# Patient Record
Sex: Female | Born: 1990 | Hispanic: Yes | Marital: Single | Smoking: Never smoker
Health system: Southern US, Community
[De-identification: ages and names within clinical notes are randomized; demographics above are authoritative.]

## PROBLEM LIST (undated history)

## (undated) ENCOUNTER — Inpatient Hospital Stay (HOSPITAL_COMMUNITY): Payer: Self-pay

## (undated) DIAGNOSIS — F419 Anxiety disorder, unspecified: Secondary | ICD-10-CM

## (undated) DIAGNOSIS — R51 Headache: Secondary | ICD-10-CM

## (undated) DIAGNOSIS — R519 Headache, unspecified: Secondary | ICD-10-CM

## (undated) HISTORY — PX: NO PAST SURGERIES: SHX2092

---

## 2017-02-23 ENCOUNTER — Inpatient Hospital Stay (HOSPITAL_COMMUNITY)
Admission: AD | Admit: 2017-02-23 | Discharge: 2017-02-23 | Disposition: A | Discharge status: Home / Self Care | Payer: Medicaid Other | Source: Ambulatory Visit | Attending: Obstetrics & Gynecology | Admitting: Obstetrics & Gynecology

## 2017-02-23 ENCOUNTER — Encounter (HOSPITAL_COMMUNITY): Payer: Self-pay | Admitting: *Deleted

## 2017-02-23 ENCOUNTER — Inpatient Hospital Stay (HOSPITAL_COMMUNITY): Payer: Medicaid Other

## 2017-02-23 DIAGNOSIS — O208 Other hemorrhage in early pregnancy: Secondary | ICD-10-CM | POA: Diagnosis not present

## 2017-02-23 DIAGNOSIS — O26891 Other specified pregnancy related conditions, first trimester: Secondary | ICD-10-CM | POA: Insufficient documentation

## 2017-02-23 DIAGNOSIS — R109 Unspecified abdominal pain: Secondary | ICD-10-CM | POA: Diagnosis not present

## 2017-02-23 DIAGNOSIS — O9989 Other specified diseases and conditions complicating pregnancy, childbirth and the puerperium: Secondary | ICD-10-CM

## 2017-02-23 DIAGNOSIS — O418X10 Other specified disorders of amniotic fluid and membranes, first trimester, not applicable or unspecified: Secondary | ICD-10-CM

## 2017-02-23 DIAGNOSIS — Z3A08 8 weeks gestation of pregnancy: Secondary | ICD-10-CM | POA: Insufficient documentation

## 2017-02-23 DIAGNOSIS — O26899 Other specified pregnancy related conditions, unspecified trimester: Secondary | ICD-10-CM

## 2017-02-23 DIAGNOSIS — Z3491 Encounter for supervision of normal pregnancy, unspecified, first trimester: Secondary | ICD-10-CM

## 2017-02-23 DIAGNOSIS — O468X1 Other antepartum hemorrhage, first trimester: Secondary | ICD-10-CM

## 2017-02-23 LAB — URINALYSIS, ROUTINE W REFLEX MICROSCOPIC
Bilirubin Urine: NEGATIVE
Glucose, UA: NEGATIVE mg/dL
Ketones, ur: NEGATIVE mg/dL
Leukocytes, UA: NEGATIVE
NITRITE: NEGATIVE
Protein, ur: NEGATIVE mg/dL
SPECIFIC GRAVITY, URINE: 1.005 (ref 1.005–1.030)
pH: 6 (ref 5.0–8.0)

## 2017-02-23 LAB — WET PREP, GENITAL
CLUE CELLS WET PREP: NONE SEEN
Sperm: NONE SEEN
Trich, Wet Prep: NONE SEEN
YEAST WET PREP: NONE SEEN

## 2017-02-23 LAB — HCG, QUANTITATIVE, PREGNANCY: HCG, BETA CHAIN, QUANT, S: 57072 m[IU]/mL — AB (ref <5)

## 2017-02-23 LAB — ABO/RH: ABO/RH(D): A POS

## 2017-02-23 LAB — CBC
HEMATOCRIT: 32.8 % — AB (ref 36.0–46.0)
HEMOGLOBIN: 11.7 g/dL — AB (ref 12.0–15.0)
MCH: 33.3 pg (ref 26.0–34.0)
MCHC: 35.7 g/dL (ref 30.0–36.0)
MCV: 93.4 fL (ref 78.0–100.0)
Platelets: 187 10*3/uL (ref 150–400)
RBC: 3.51 MIL/uL — ABNORMAL LOW (ref 3.87–5.11)
RDW: 12.8 % (ref 11.5–15.5)
WBC: 4.9 10*3/uL (ref 4.0–10.5)

## 2017-02-23 LAB — URINALYSIS, MICROSCOPIC (REFLEX)
Bacteria, UA: NONE SEEN
WBC, UA: NONE SEEN WBC/hpf (ref 0–5)

## 2017-02-23 LAB — POCT PREGNANCY, URINE: PREG TEST UR: POSITIVE — AB

## 2017-02-23 NOTE — MAU Note (Signed)
Hasn't had a period since March.  Had taken plan B in Feb. Did a HPT yesterday, was faintly positive.  occ rare cramps and breast tenderness. Had light spotting last night

## 2017-02-23 NOTE — MAU Provider Note (Signed)
History     CSN: [id number]  Arrival date and time: 02/23/17 1048  First Provider Initiated Contact with Patient 02/23/17 1137      Chief Complaint  Patient presents with  . Possible Pregnancy  . Abdominal Pain   HPI  Kelsey Levy is a 26 y.o. G1P0 at [redacted]w[redacted]d by LMP who presents with abdominal pain. Had positive HPT yesterday. States she took plan B after unprotected intercourse in November & February. LMP was 3/20 & was normal for her.   Reports lower abdominal cramping for the last few weeks that has been intermittent. Denies n/v/d, constipation, dysuria, vaginal bleeding, or vaginal discharge. Rates pain 2/10. Has not been treating.   OB History    Gravida Para Term Preterm AB Living   1             SAB TAB Ectopic Multiple Live Births                  Past Medical History:  Diagnosis Date  . Medical history non-contributory     Past Surgical History:  Procedure Laterality Date  . NO PAST SURGERIES      No family history on file.  Social History  Substance Use Topics  . Smoking status: Not on file  . Smokeless tobacco: Not on file  . Alcohol use Not on file    Allergies:  Allergies  Allergen Reactions  . Latex Anaphylaxis    No prescriptions prior to admission.    Review of Systems  Constitutional: Negative.   Gastrointestinal: Positive for abdominal pain. Negative for constipation, diarrhea, nausea and vomiting.  Genitourinary: Negative.    Physical Exam   Blood pressure (!) 120/54, pulse 60, temperature 98.2 F (36.8 C), temperature source Oral, resp. rate 16, height 5\' 6"  (1.676 m), weight 173 lb 12 oz (78.8 kg), last menstrual period 12/26/2016, SpO2 100 %.  Physical Exam  Nursing note and vitals reviewed. Constitutional: She is oriented to person, place, and time. She appears well-developed and well-nourished. No distress.  HENT:  Head: Normocephalic and atraumatic.  Eyes: Conjunctivae are normal. Right eye exhibits no discharge. Left eye  exhibits no discharge. No scleral icterus.  Neck: Normal range of motion.  Cardiovascular: Normal rate, regular rhythm and normal heart sounds.   No murmur heard. Respiratory: Effort normal and breath sounds normal. No respiratory distress. She has no wheezes.  GI: Soft. Bowel sounds are normal. She exhibits no distension. There is no tenderness. There is no rebound and no guarding.  Neurological: She is alert and oriented to person, place, and time.  Skin: Skin is warm and dry. She is not diaphoretic.  Psychiatric: She has a normal mood and affect. Her behavior is normal. Judgment and thought content normal.    MAU Course  Procedures Results for orders placed or performed during the hospital encounter of 02/23/17 (from the past 24 hour(s))  Urinalysis, Routine w reflex microscopic     Status: Abnormal   Collection Time: 02/23/17 11:05 AM  Result Value Ref Range   Color, Urine YELLOW YELLOW   APPearance CLEAR CLEAR   Specific Gravity, Urine 1.005 1.005 - 1.030   pH 6.0 5.0 - 8.0   Glucose, UA NEGATIVE NEGATIVE mg/dL   Hgb urine dipstick SMALL (A) NEGATIVE   Bilirubin Urine NEGATIVE NEGATIVE   Ketones, ur NEGATIVE NEGATIVE mg/dL   Protein, ur NEGATIVE NEGATIVE mg/dL   Nitrite NEGATIVE NEGATIVE   Leukocytes, UA NEGATIVE NEGATIVE  Urinalysis, Microscopic (reflex)  Status: Abnormal   Collection Time: 02/23/17 11:05 AM  Result Value Ref Range   RBC / HPF 0-5 0 - 5 RBC/hpf   WBC, UA NONE SEEN 0 - 5 WBC/hpf   Bacteria, UA NONE SEEN NONE SEEN   Squamous Epithelial / LPF 0-5 (A) NONE SEEN  Pregnancy, urine POC     Status: Abnormal   Collection Time: 02/23/17 11:13 AM  Result Value Ref Range   Preg Test, Ur POSITIVE (A) NEGATIVE  Wet prep, genital     Status: Abnormal   Collection Time: 02/23/17 11:33 AM  Result Value Ref Range   Yeast Wet Prep HPF POC NONE SEEN NONE SEEN   Trich, Wet Prep NONE SEEN NONE SEEN   Clue Cells Wet Prep HPF POC NONE SEEN NONE SEEN   WBC, Wet Prep HPF  POC FEW (A) NONE SEEN   Sperm NONE SEEN   CBC     Status: Abnormal   Collection Time: 02/23/17 11:36 AM  Result Value Ref Range   WBC 4.9 4.0 - 10.5 K/uL   RBC 3.51 (L) 3.87 - 5.11 MIL/uL   Hemoglobin 11.7 (L) 12.0 - 15.0 g/dL   HCT 16.1 (L) 09.6 - 04.5 %   MCV 93.4 78.0 - 100.0 fL   MCH 33.3 26.0 - 34.0 pg   MCHC 35.7 30.0 - 36.0 g/dL   RDW 40.9 81.1 - 91.4 %   Platelets 187 150 - 400 K/uL  ABO/Rh     Status: None   Collection Time: 02/23/17 11:36 AM  Result Value Ref Range   ABO/RH(D) A POS   hCG, quantitative, pregnancy     Status: Abnormal   Collection Time: 02/23/17 11:36 AM  Result Value Ref Range   hCG, Beta Chain, Quant, S 57,072 (H) <5 mIU/mL   US Ob Comp Less 14 Wks  Result Date: 02/23/2017 CLINICAL DATA:  Patient with abdominal pain. Assess for viability of pregnancy. EXAM: OBSTETRIC <14 WK [place] AND TRANSVAGINAL OB US TECHNIQUE: Both transabdominal and transvaginal ultrasound examinations were performed for complete evaluation of the gestation as well as the maternal uterus, adnexal regions, and pelvic cul-de-sac. Transvaginal technique was performed to assess early pregnancy. COMPARISON:  None. FINDINGS: Intrauterine gestational sac: Single Yolk sac:  Visualized. Embryo:  Visualized. Cardiac Activity: Visualized. Heart Rate: 162  bpm CRL:  16  mm   8 w   0 d                  [place] EDC: 09/27/2017 Subchorionic hemorrhage:  Small Maternal uterus/adnexae: Normal right left ovaries. No free fluid in the pelvis. IMPRESSION: Single live intrauterine gestation.  Small subchorionic hemorrhage. Electronically Signed   By: Annia Belt M.D.   On: 02/23/2017 14:14   US Ob Transvaginal  Result Date: 02/23/2017 CLINICAL DATA:  Patient with abdominal pain. Assess for viability of pregnancy. EXAM: OBSTETRIC <14 WK [place] AND TRANSVAGINAL OB US TECHNIQUE: Both transabdominal and transvaginal ultrasound examinations were performed for complete evaluation of the gestation as well as the maternal uterus,  adnexal regions, and pelvic cul-de-sac. Transvaginal technique was performed to assess early pregnancy. COMPARISON:  None. FINDINGS: Intrauterine gestational sac: Single Yolk sac:  Visualized. Embryo:  Visualized. Cardiac Activity: Visualized. Heart Rate: 162  bpm CRL:  16  mm   8 w   0 d                  [place] EDC: 09/27/2017 Subchorionic hemorrhage:  Small Maternal uterus/adnexae: Normal right left  ovaries. No free fluid in the pelvis. IMPRESSION: Single live intrauterine gestation.  Small subchorionic hemorrhage. Electronically Signed   By: Annia Beltrew  Davis M.D.   On: 02/23/2017 14:14   MDM +UPT UA, wet prep, GC/chlamydia, CBC, ABO/Rh, quant hCG, HIV, and US today to rule out ectopic pregnancy A positive Ultrasound shows SIUP with cardiac activity & small [facility] Assessment and Plan  A; 1. Normal IUP (intrauterine pregnancy) on prenatal ultrasound, first trimester   2. Abdominal pain affecting pregnancy   3. Subchorionic hematoma in first trimester, single or unspecified fetus    P: Discharge home Discussed reasons to return to MAU Patient unsure if she will continue with pregnancy -- if doesn't have TAB should start prenatal care  Judeth Hornrin Guenther [place]uns[place] 02/23/2017, 11:36 AM

## 2017-02-23 NOTE — Discharge Instructions (Signed)
Subchorionic Hematoma °A subchorionic hematoma is a gathering of blood between the outer wall of the placenta and the inner wall of the womb (uterus). The placenta is the organ that connects the fetus to the wall of the uterus. The placenta performs the feeding, breathing (oxygen to the fetus), and waste removal (excretory work) of the fetus. °Subchorionic hematoma is the most common abnormality found on a result from ultrasonography done during the first trimester or early second trimester of pregnancy. If there has been little or no vaginal bleeding, early small hematomas usually shrink on their own and do not affect your baby or pregnancy. The blood is gradually absorbed over 1-2 weeks. When bleeding starts later in pregnancy or the hematoma is larger or occurs in an older pregnant woman, the outcome may not be as good. Larger hematomas may get bigger, which increases the chances for miscarriage. Subchorionic hematoma also increases the risk of premature detachment of the placenta from the uterus, preterm (premature) labor, and stillbirth. °Follow these instructions at home: °· Stay on bed rest if your health care provider recommends this. Although bed rest will not prevent more bleeding or prevent a miscarriage, your health care provider may recommend bed rest until you are advised otherwise. °· Avoid heavy lifting (more than 10 lb [4.5 kg]), exercise, sexual intercourse, or douching as directed by your health care provider. °· Keep track of the number of pads you use each day and how soaked (saturated) they are. Write down this information. °· Do not use tampons. °· Keep all follow-up appointments as directed by your health care provider. Your health care provider may ask you to have follow-up blood tests or ultrasound tests or both. °Get help right away if: °· You have severe cramps in your stomach, back, abdomen, or pelvis. °· You have a fever. °· You pass large clots or tissue. Save any tissue for your  health care provider to look at. °· Your bleeding increases or you become lightheaded, feel weak, or have fainting episodes. °This information is not intended to replace advice given to you by your health care provider. Make sure you discuss any questions you have with your health care provider. °Document Released: 01/10/2007 Document Revised: 03/02/2016 Document Reviewed: 04/24/2013 °Elsevier Interactive Patient Education © 2017 [employer] ° °

## 2017-02-23 NOTE — Progress Notes (Addendum)
G1 @ 8.[redacted] wksga. Presents to triage to make certain she is pregnant. Pt states took Plan B in Feb. Unsure if pregnant but thinks she is as did home pregn ltest ast night and showed a faint line visible and noted. Last period was December 26, 2016. Intercourse after period.   UA and UPT done. + pregn test.  wetprep and GC done and tubed to lab  1310: pt called out inquiring what's taking so long. Informed pt waiting on u/s to call us when ready.  H20 given per request  1313: U/S notified. Stated will call when ready for pt  1325: pt to U/S  1354: pt back from U/S.   1425: provider at bs updating pt and POC discussed. Awaiting discharge orders.

## 2017-02-24 LAB — HIV ANTIBODY (ROUTINE TESTING W REFLEX): HIV SCREEN 4TH GENERATION: NONREACTIVE

## 2017-02-26 LAB — GC/CHLAMYDIA PROBE AMP (~~LOC~~) NOT AT ARMC
CHLAMYDIA, DNA PROBE: NEGATIVE
Neisseria Gonorrhea: NEGATIVE

## 2017-03-12 ENCOUNTER — Other Ambulatory Visit (HOSPITAL_COMMUNITY): Payer: Self-pay | Admitting: Nurse Practitioner

## 2017-03-12 DIAGNOSIS — Z3682 Encounter for antenatal screening for nuchal translucency: Secondary | ICD-10-CM

## 2017-03-28 ENCOUNTER — Encounter: Payer: Self-pay | Admitting: Obstetrics and Gynecology

## 2017-03-29 ENCOUNTER — Other Ambulatory Visit (HOSPITAL_COMMUNITY): Payer: Self-pay | Admitting: Nurse Practitioner

## 2017-03-29 ENCOUNTER — Ambulatory Visit (HOSPITAL_COMMUNITY)
Admission: RE | Admit: 2017-03-29 | Discharge: 2017-03-29 | Disposition: A | Discharge status: Home / Self Care | Payer: Medicaid Other | Source: Ambulatory Visit | Attending: Nurse Practitioner | Admitting: Nurse Practitioner

## 2017-03-29 ENCOUNTER — Encounter (HOSPITAL_COMMUNITY): Payer: Self-pay

## 2017-03-29 DIAGNOSIS — Z3682 Encounter for antenatal screening for nuchal translucency: Secondary | ICD-10-CM

## 2017-03-29 DIAGNOSIS — Z3A12 12 weeks gestation of pregnancy: Secondary | ICD-10-CM

## 2017-03-29 NOTE — Addendum Note (Signed)
Encounter addended by: Levonne HubertStalter, Fardowsa Authier M on: 03/29/2017 11:26 AM<BR>    Actions taken: Imaging Exam ended

## 2017-04-02 ENCOUNTER — Other Ambulatory Visit: Payer: Self-pay

## 2017-05-28 ENCOUNTER — Encounter (HOSPITAL_COMMUNITY): Payer: Self-pay | Admitting: Emergency Medicine

## 2017-05-28 ENCOUNTER — Emergency Department (HOSPITAL_COMMUNITY)
Admission: EM | Admit: 2017-05-28 | Discharge: 2017-05-29 | Discharge status: Against Medical Advice | Payer: Medicaid Other | Attending: Emergency Medicine | Admitting: Emergency Medicine

## 2017-05-28 DIAGNOSIS — K0889 Other specified disorders of teeth and supporting structures: Secondary | ICD-10-CM | POA: Diagnosis not present

## 2017-05-28 DIAGNOSIS — Z5321 Procedure and treatment not carried out due to patient leaving prior to being seen by health care provider: Secondary | ICD-10-CM | POA: Insufficient documentation

## 2017-05-28 NOTE — ED Triage Notes (Signed)
Pt reports L upper dental pain present X2 days. Pt is [redacted] weeks pregnant.

## 2017-05-28 NOTE — ED Notes (Signed)
Pt up to nurse first, asking about [facility] and if she could just go there. Discussed with pt current wait time here and that we would be happy to eval her here. Pt says she just thinks she would just like to come back in the morning. Apologized for delay

## 2017-06-29 ENCOUNTER — Inpatient Hospital Stay (HOSPITAL_COMMUNITY)
Admission: AD | Admit: 2017-06-29 | Discharge: 2017-06-29 | Disposition: A | Discharge status: Home / Self Care | Payer: Medicaid Other | Source: Ambulatory Visit | Attending: Obstetrics & Gynecology | Admitting: Obstetrics & Gynecology

## 2017-06-29 ENCOUNTER — Encounter (HOSPITAL_COMMUNITY): Payer: Self-pay

## 2017-06-29 DIAGNOSIS — O98812 Other maternal infectious and parasitic diseases complicating pregnancy, second trimester: Secondary | ICD-10-CM | POA: Diagnosis not present

## 2017-06-29 DIAGNOSIS — R109 Unspecified abdominal pain: Secondary | ICD-10-CM | POA: Diagnosis not present

## 2017-06-29 DIAGNOSIS — B3731 Acute candidiasis of vulva and vagina: Secondary | ICD-10-CM

## 2017-06-29 DIAGNOSIS — Z91040 Latex allergy status: Secondary | ICD-10-CM | POA: Diagnosis not present

## 2017-06-29 DIAGNOSIS — B373 Candidiasis of vulva and vagina: Secondary | ICD-10-CM | POA: Insufficient documentation

## 2017-06-29 DIAGNOSIS — Z3A26 26 weeks gestation of pregnancy: Secondary | ICD-10-CM | POA: Diagnosis not present

## 2017-06-29 DIAGNOSIS — O26892 Other specified pregnancy related conditions, second trimester: Secondary | ICD-10-CM

## 2017-06-29 DIAGNOSIS — O26899 Other specified pregnancy related conditions, unspecified trimester: Secondary | ICD-10-CM

## 2017-06-29 HISTORY — DX: Headache: R51

## 2017-06-29 HISTORY — DX: Headache, unspecified: R51.9

## 2017-06-29 LAB — URINALYSIS, ROUTINE W REFLEX MICROSCOPIC
Bilirubin Urine: NEGATIVE
GLUCOSE, UA: NEGATIVE mg/dL
Hgb urine dipstick: NEGATIVE
Ketones, ur: NEGATIVE mg/dL
LEUKOCYTES UA: NEGATIVE
Nitrite: NEGATIVE
PH: 7 (ref 5.0–8.0)
PROTEIN: NEGATIVE mg/dL
Specific Gravity, Urine: 1.01 (ref 1.005–1.030)

## 2017-06-29 LAB — WET PREP, GENITAL
Clue Cells Wet Prep HPF POC: NONE SEEN
SPERM: NONE SEEN
Trich, Wet Prep: NONE SEEN

## 2017-06-29 MED ORDER — TERCONAZOLE 0.4 % VA CREA: 1.0000 | TOPICAL_CREAM | Freq: Every day | VAGINAL | 0 refills | Status: DC

## 2017-06-29 NOTE — MAU Note (Signed)
Urine in lab 

## 2017-06-29 NOTE — MAU Note (Signed)
Kelsey Levy is a 26 y.o. Female presenting to MAU with abdominal pain that is a 5 out of 10, stretching around to her back. She describes it as bad period cramps that woke her up this morning at 0630. She took two Tylenol at 1130. She reports adequate fetal movement and no vaginal bleeding.

## 2017-06-29 NOTE — MAU Note (Signed)
Since 630 am having a steady abdominal cramp. Reports constant.   Denies vaginal bleeding or LOF.  +FM

## 2017-06-29 NOTE — MAU Provider Note (Signed)
History     CSN: [id number]  Arrival date and time: 06/29/17 1254   None     Chief Complaint  Patient presents with  . Abdominal Pain   G1 .0 here with abdominal cramping. Sx started at 6am this morning. Describes as constant cramp in upper and lower abdomen. She took 2 Tylenol and had relief. She denies VB, vaginal discharge, LOF, and ctx. Reports good FM. Denies urinary sx.    OB History    Gravida Para Term Preterm AB Living   1         0   SAB TAB Ectopic Multiple Live Births                  Past Medical History:  Diagnosis Date  . Headache   . Medical history non-contributory     Past Surgical History:  Procedure Laterality Date  . NO PAST SURGERIES      History reviewed. No pertinent family history.  Social History  Substance Use Topics  . Smoking status: Never Smoker  . Smokeless tobacco: Never Used  . Alcohol use No    Allergies:  Allergies  Allergen Reactions  . Latex Anaphylaxis    Prescriptions Prior to Admission  Medication Sig Dispense Refill Last Dose  . Biotin 13244 MCG TBDP Take 1 tablet by mouth daily.   Not Taking  . Prenatal Vit w/Fe-Methylfol-FA (PNV PO) Take by mouth.   Taking    Review of Systems  Constitutional: Negative for fever.  Gastrointestinal: Positive for abdominal pain. Negative for constipation, diarrhea, nausea and vomiting.  Genitourinary: Negative for dysuria, hematuria, urgency, vaginal bleeding and vaginal discharge.   Physical Exam   Blood pressure (!) 118/51, pulse 76, temperature 98.2 F (36.8 C), temperature source Oral, resp. rate 18, weight 226 lb 0.6 oz (102.5 kg), last menstrual period 12/26/2016, SpO2 100 %.  Physical Exam  Nursing note and vitals reviewed. Constitutional: She is oriented to person, place, and time. She appears well-developed and well-nourished. No distress.  HENT:  Head: Normocephalic.  Neck: Normal range of motion.  Cardiovascular: Normal rate.   Respiratory: Effort normal.   GI: Soft. She exhibits no distension. There is no tenderness.  gravid  Genitourinary:  Genitourinary Comments: SVE closed/thick White curdy discharge from introitus  Musculoskeletal: Normal range of motion.  Neurological: She is alert and oriented to person, place, and time.  Skin: Skin is warm and dry.  Psychiatric: She has a normal mood and affect.  EFM: 135 bpm, mod variability, + accels, no decels Toco: none  Results for orders placed or performed during the hospital encounter of 06/29/17 (from the past 24 hour(s))  Urinalysis, Routine w reflex microscopic     Status: Abnormal   Collection Time: 06/29/17  1:29 PM  Result Value Ref Range   Color, Urine STRAW (A) YELLOW   APPearance CLEAR CLEAR   Specific Gravity, Urine 1.010 1.005 - 1.030   pH 7.0 5.0 - 8.0   Glucose, UA NEGATIVE NEGATIVE mg/dL   Hgb urine dipstick NEGATIVE NEGATIVE   Bilirubin Urine NEGATIVE NEGATIVE   Ketones, ur NEGATIVE NEGATIVE mg/dL   Protein, ur NEGATIVE NEGATIVE mg/dL   Nitrite NEGATIVE NEGATIVE   Leukocytes, UA NEGATIVE NEGATIVE  Wet prep, genital     Status: Abnormal   Collection Time: 06/29/17  2:16 PM  Result Value Ref Range   Yeast Wet Prep HPF POC PRESENT (A) NONE SEEN   Trich, Wet Prep NONE SEEN NONE SEEN   Clue  Cells Wet Prep HPF POC NONE SEEN NONE SEEN   WBC, Wet Prep HPF POC MANY (A) NONE SEEN   Sperm NONE SEEN     MAU Course  Procedures  MDM Labs ordered and reviewed. No evidence of UTI or PTL. FHT reassuring. Will treat yeast. Stable for discharge home  Assessment and Plan   1. [redacted] weeks gestation of pregnancy   2. Yeast vaginitis   3. Abdominal cramping    Discharge home Follow up at [facility] as scheduled PTL precautions Rx Terazol  Allergies as of 06/29/2017      Reactions   Latex Anaphylaxis      Medication List    TAKE these medications   Biotin 16109 MCG Tbdp Take 1 tablet by mouth daily.   PNV PO Take by mouth.   terconazole 0.4 % vaginal cream Commonly  known as:  TERAZOL 7 Place 1 applicator vaginally at bedtime.            Discharge Care Instructions        Start     Ordered   06/29/17 0000  Discharge patient    Question Answer Comment  Discharge disposition 01-Home or Self Care   Discharge patient date 06/29/2017      06/29/17 1515   06/29/17 0000  terconazole (TERAZOL 7) 0.4 % vaginal cream  Daily at bedtime    Question:  Supervising Provider  Answer:  Adam Phenix   06/29/17 1515     Donette Larry, CNM 06/29/2017, 3:53 PM

## 2017-06-29 NOTE — Discharge Instructions (Signed)

## 2017-07-02 LAB — GC/CHLAMYDIA PROBE AMP (~~LOC~~) NOT AT ARMC
Chlamydia: NEGATIVE
NEISSERIA GONORRHEA: NEGATIVE

## 2017-09-03 LAB — OB RESULTS CONSOLE GBS: STREP GROUP B AG: POSITIVE

## 2017-09-22 ENCOUNTER — Encounter (HOSPITAL_COMMUNITY): Payer: Self-pay | Admitting: Anesthesiology

## 2017-09-22 ENCOUNTER — Encounter (HOSPITAL_COMMUNITY): Payer: Self-pay | Admitting: *Deleted

## 2017-09-22 ENCOUNTER — Inpatient Hospital Stay (HOSPITAL_COMMUNITY)
Admission: AD | Admit: 2017-09-22 | Discharge: 2017-09-26 | DRG: 787 | Disposition: A | Discharge status: Home / Self Care | Payer: Medicaid Other | Source: Ambulatory Visit | Attending: Obstetrics & Gynecology | Admitting: Obstetrics & Gynecology

## 2017-09-22 DIAGNOSIS — Z3A38 38 weeks gestation of pregnancy: Secondary | ICD-10-CM

## 2017-09-22 DIAGNOSIS — O4292 Full-term premature rupture of membranes, unspecified as to length of time between rupture and onset of labor: Principal | ICD-10-CM | POA: Diagnosis present

## 2017-09-22 DIAGNOSIS — O99824 Streptococcus B carrier state complicating childbirth: Secondary | ICD-10-CM | POA: Diagnosis present

## 2017-09-22 DIAGNOSIS — O4290 Premature rupture of membranes, unspecified as to length of time between rupture and onset of labor, unspecified weeks of gestation: Secondary | ICD-10-CM | POA: Diagnosis present

## 2017-09-22 DIAGNOSIS — F1290 Cannabis use, unspecified, uncomplicated: Secondary | ICD-10-CM | POA: Diagnosis present

## 2017-09-22 DIAGNOSIS — O99324 Drug use complicating childbirth: Secondary | ICD-10-CM | POA: Diagnosis present

## 2017-09-22 DIAGNOSIS — Z98891 History of uterine scar from previous surgery: Secondary | ICD-10-CM

## 2017-09-22 DIAGNOSIS — O4212 Full-term premature rupture of membranes, onset of labor more than 24 hours following rupture: Secondary | ICD-10-CM | POA: Diagnosis not present

## 2017-09-22 HISTORY — DX: Anxiety disorder, unspecified: F41.9

## 2017-09-22 LAB — RAPID HIV SCREEN (HIV 1/2 AB+AG)
HIV 1/2 ANTIBODIES: NONREACTIVE
HIV-1 P24 ANTIGEN - HIV24: NONREACTIVE

## 2017-09-22 LAB — CBC
HEMATOCRIT: 35.9 % — AB (ref 36.0–46.0)
Hemoglobin: 11.8 g/dL — ABNORMAL LOW (ref 12.0–15.0)
MCH: 31.5 pg (ref 26.0–34.0)
MCHC: 32.9 g/dL (ref 30.0–36.0)
MCV: 95.7 fL (ref 78.0–100.0)
Platelets: 235 10*3/uL (ref 150–400)
RBC: 3.75 MIL/uL — ABNORMAL LOW (ref 3.87–5.11)
RDW: 13.3 % (ref 11.5–15.5)
WBC: 9.6 10*3/uL (ref 4.0–10.5)

## 2017-09-22 LAB — TYPE AND SCREEN
ABO/RH(D): A POS
ANTIBODY SCREEN: NEGATIVE

## 2017-09-22 LAB — RAPID URINE DRUG SCREEN, HOSP PERFORMED
Amphetamines: NOT DETECTED
BENZODIAZEPINES: NOT DETECTED
Barbiturates: NOT DETECTED
Cocaine: NOT DETECTED
Opiates: NOT DETECTED
Tetrahydrocannabinol: NOT DETECTED

## 2017-09-22 LAB — POCT FERN TEST: POCT Fern Test: POSITIVE

## 2017-09-22 MED ORDER — OXYTOCIN 40 UNITS IN LACTATED RINGERS INFUSION - SIMPLE MED
1.0000 m[IU]/min | INTRAVENOUS | Status: DC
  Administered 2017-09-22: 2 m[IU]/min via INTRAVENOUS
  Filled 2017-09-22: qty 1000

## 2017-09-22 MED ORDER — MISOPROSTOL 25 MCG QUARTER TABLET
25.0000 ug | ORAL_TABLET | ORAL | Status: DC | PRN
  Filled 2017-09-22: qty 1

## 2017-09-22 MED ORDER — FENTANYL CITRATE (PF) 100 MCG/2ML IJ SOLN
100.0000 ug | INTRAMUSCULAR | Status: DC | PRN
  Administered 2017-09-23 (×2): 100 ug via INTRAVENOUS
  Administered 2017-09-23: 10 ug via INTRAVENOUS
  Administered 2017-09-23 (×4): 100 ug via INTRAVENOUS
  Filled 2017-09-22 (×6): qty 2

## 2017-09-22 MED ORDER — OXYTOCIN BOLUS FROM INFUSION: 500.0000 mL | Freq: Once | INTRAVENOUS | Status: DC

## 2017-09-22 MED ORDER — LACTATED RINGERS IV SOLN
INTRAVENOUS | Status: DC
  Administered 2017-09-22 – 2017-09-23 (×4): via INTRAVENOUS

## 2017-09-22 MED ORDER — OXYCODONE-ACETAMINOPHEN 5-325 MG PO TABS
1.0000 | ORAL_TABLET | ORAL | Status: DC | PRN
Start: 2017-09-22 — End: 2017-09-24

## 2017-09-22 MED ORDER — PENICILLIN G POTASSIUM 5000000 UNITS IJ SOLR
5.0000 10*6.[IU] | Freq: Once | INTRAVENOUS | Status: AC
  Administered 2017-09-22: 5 10*6.[IU] via INTRAVENOUS
  Filled 2017-09-22: qty 5

## 2017-09-22 MED ORDER — LACTATED RINGERS IV SOLN
500.0000 mL | INTRAVENOUS | Status: DC | PRN
  Administered 2017-09-23 (×3): 500 mL via INTRAVENOUS

## 2017-09-22 MED ORDER — ACETAMINOPHEN 325 MG PO TABS: 650.0000 mg | ORAL_TABLET | ORAL | Status: DC | PRN

## 2017-09-22 MED ORDER — OXYCODONE-ACETAMINOPHEN 5-325 MG PO TABS: 2.0000 | ORAL_TABLET | ORAL | Status: DC | PRN

## 2017-09-22 MED ORDER — SOD CITRATE-CITRIC ACID 500-334 MG/5ML PO SOLN
30.0000 mL | ORAL | Status: DC | PRN
  Administered 2017-09-23: 30 mL via ORAL
  Filled 2017-09-22: qty 15

## 2017-09-22 MED ORDER — ONDANSETRON HCL 4 MG/2ML IJ SOLN: 4.0000 mg | Freq: Four times a day (QID) | INTRAMUSCULAR | Status: DC | PRN

## 2017-09-22 MED ORDER — TERBUTALINE SULFATE 1 MG/ML IJ SOLN
0.2500 mg | Freq: Once | INTRAMUSCULAR | Status: AC | PRN
  Administered 2017-09-23: 0.25 mg via SUBCUTANEOUS
  Filled 2017-09-22: qty 1

## 2017-09-22 MED ORDER — FLEET ENEMA 7-19 GM/118ML RE ENEM: 1.0000 | ENEMA | RECTAL | Status: DC | PRN

## 2017-09-22 MED ORDER — LIDOCAINE HCL (PF) 1 % IJ SOLN: 30.0000 mL | INTRAMUSCULAR | Status: DC | PRN

## 2017-09-22 MED ORDER — PENICILLIN G POT IN DEXTROSE 60000 UNIT/ML IV SOLN
3.0000 10*6.[IU] | INTRAVENOUS | Status: DC
  Administered 2017-09-22 – 2017-09-23 (×7): 3 10*6.[IU] via INTRAVENOUS
  Filled 2017-09-22 (×9): qty 50

## 2017-09-22 MED ORDER — MISOPROSTOL 50MCG HALF TABLET
50.0000 ug | ORAL_TABLET | ORAL | Status: DC | PRN
  Administered 2017-09-22 (×2): 50 ug via BUCCAL
  Filled 2017-09-22 (×3): qty 1

## 2017-09-22 MED ORDER — TERBUTALINE SULFATE 1 MG/ML IJ SOLN: 0.2500 mg | Freq: Once | INTRAMUSCULAR | Status: DC | PRN

## 2017-09-22 MED ORDER — OXYTOCIN 40 UNITS IN LACTATED RINGERS INFUSION - SIMPLE MED: 2.5000 [IU]/h | INTRAVENOUS | Status: DC

## 2017-09-22 NOTE — Progress Notes (Signed)
Labor Progress Note Kelsey Levy is a 26 y.o. G1P0 at 373w1d presented for PROM. S: Patient feeling more uncomfortable with contractions  O:  BP 121/78   Pulse 86   Temp 98.3 F (36.8 C) (Oral)   Resp 16   Ht 5\' 6"  (1.676 m)   Wt 250 lb (113.4 kg)   LMP 12/26/2016 (Approximate)   SpO2 98%   BMI 40.35 kg/m  EFM: 135 bpm/mod var/no decels  CVE: Dilation: 3.5 Effacement (%): 30 Station: -3 Presentation: Vertex Exam by:: Mary SwazilandJordan Johnson, RN    A&P: 26 y.o. G1P0 7073w1d here for PROM #Labor: FB removed. Recheck 4 hours after last cytotec. If cervix thinned, will start pitocin.   Rolm BookbinderAmber Shana Younge, DO 9:03 PM

## 2017-09-22 NOTE — Progress Notes (Signed)
Labor Progress Note Kelsey Levy is a 26 y.o. G1P0 at 3316w1d presented for PROM. S: No complaints  O:  BP 121/78   Pulse 86   Temp 98.3 F (36.8 C) (Oral)   Resp 16   Ht 5\' 6"  (1.676 m)   Wt 250 lb (113.4 kg)   LMP 12/26/2016 (Approximate)   SpO2 98%   BMI 40.35 kg/m  EFM: 130 bpm/mod var/pos acels/no decels  CVE: Dilation: 5 Effacement (%): 70 Cervical Position: Middle Station: -2 Presentation: Vertex Exam by:: Dr. Rachelle HoraMoss   A&P: 26 y.o. G1P0 4116w1d here for PROM. #Labor: Progressing well. Start pitocin. #FWB: cat 1   Kaydense Rizo, DO 11:03 PM

## 2017-09-22 NOTE — MAU Note (Signed)
Pt states she was having mild uc's @ 0500 this morning, stood up & felt trickling, has continued to have trickling & some gushes since then.  Clear fluid, denies bleeding.

## 2017-09-22 NOTE — H&P (Signed)
LABOR AND DELIVERY ADMISSION HISTORY AND PHYSICAL NOTE  Kelsey Levy is a 26 y.o. female G1P0 with IUP at 6132w1d by first trimester ultrasound presenting for PROM. She reports felt small LOF around 5 in the morning, but then continue to leak fluid this morning, and later had larger gush.   She reports positive fetal movement. She denies regular contractions or vaginal bleeding.  [facility]/Complications[facility] Pregnancy complications:  - Marijuana use  Past Medical History: Past Medical History:  Diagnosis Date  . Headache     Past Surgical History: Past Surgical History:  Procedure Laterality Date  . NO PAST SURGERIES      Obstetrical History: OB History    Gravida Para Term Preterm AB Living   1         0   SAB TAB Ectopic Multiple Live Births                  Social History: Social History   Socioeconomic History  . Marital status: Single    Spouse name: None  . Number of children: None  . Years of education: None  . Highest education level: None  Social Needs  . Financial resource strain: None  . Food insecurity - worry: None  . Food insecurity - inability: None  . Transportation needs - medical: None  . Transportation needs - non-medical: None  Occupational History  . None  Tobacco Use  . Smoking status: Never Smoker  . Smokeless tobacco: Never Used  Substance and Sexual Activity  . Alcohol use: No  . Drug use: No  . Sexual activity: Yes    Birth control/protection: None  Other Topics Concern  . None  Social History Narrative  . None    Family History: History reviewed. No pertinent family history.  Allergies: Allergies  Allergen Reactions  . Latex Anaphylaxis    Medications Prior to Admission  Medication Sig Dispense Refill Last Dose  . Biotin 1610910000 MCG TBDP Take 1 tablet by mouth daily.   Not Taking  . Prenatal Vit w/Fe-Methylfol-FA (PNV PO) Take by mouth.   Taking  . terconazole (TERAZOL 7) 0.4 % vaginal cream Place 1  applicator vaginally at bedtime. 45 g 0      Review of Systems  All systems reviewed and negative except as stated in HPI  Physical Exam Blood pressure (!) 113/57, pulse 99, temperature 98.2 F (36.8 C), temperature source Oral, resp. rate 18, last menstrual period 12/26/2016. General appearance: alert, oriented, NAD Lungs: normal respiratory effort Heart: regular rate Abdomen: soft, non-tender; gravid, FH appropriate for GA Extremities: No calf swelling or tenderness Presentation: cephalic Fetal monitoring: baseline rate 130, moderate variability, +acel, no decel Uterine activity: occasional ctx Dilation: 1.5 Effacement (%): Thick Station: -3 Exam by:: Dr. Sabas Sousigele  Prenatal labs: ABO, Rh: --/--/A POS (05/18 1136) Antibody:  negative Rubella:  immune RPR:   negative HBsAg:   negative HIV:   nonreactive GC/Chlamydia: negative GBS: Positive (11/26 0000)  1- hr Glucola: n/a Genetic screening:  Normal first trimester screen Anatomy US: normal anatomy  Prenatal Transfer Tool  Maternal Diabetes: No Genetic Screening: Normal Maternal Ultrasounds/Referrals: Normal Fetal Ultrasounds or other Referrals:  None Maternal Substance Abuse:  Yes:  Type: Marijuana Significant Maternal Medications:  Meds include: Other: Tums Significant Maternal Lab Results: Lab values include: Group B Strep positive  Results for orders placed or performed during the hospital encounter of 09/22/17 (from the past 24 hour(s))  POCT fern test   Collection  Time: 09/22/17  1:27 PM  Result Value Ref Range   POCT Fern Test Positive = ruptured amniotic membanes      Assessment: Kelsey Levy is a 26 y.o. G1P0 at 5280w1d here for PROM (at 5 am this morning). Not in labor  #Labor: Start cervical ripening with FB and cytotec po #Pain: Per patient's request; not planning on getting epidural #FWB: Cat I #ID:  GBS positive -- start PCN #MOF: breast #MOC:POP #Circ:  N/a (girl)  Kandra NicolasJulie P  Gabor Lusk 09/22/2017, 1:50 PM

## 2017-09-22 NOTE — Anesthesia Pain Management Evaluation Note (Signed)
  [facility] Pain Management Visit Note  Patient: Kelsey Levy, 26 y.o., female  "Hello [facility]. We have an anesthesia team available at all times to provide care throughout the hospital, including epidural management and anesthesia for C-section. I don't know your plan for the delivery whether it a natural birth, water birth, IV sedation, nitrous supplementation, doula or epidural, but we want to meet your pain goals."   1.Was your pain managed to your expectations on prior hospitalizations?   No prior hospitalizations  2.What is your expectation for pain management during this hospitalization?     IV pain meds  3.How can we help you reach that goal? Support prn  Record the patient's initial score and the patient's pain goal.   Pain: 1  Pain Goal: 7 [facility] wants you to be able to say your pain was always managed very well.  [facility],Kelsey Levy 09/22/2017

## 2017-09-23 ENCOUNTER — Inpatient Hospital Stay (HOSPITAL_COMMUNITY): Payer: Medicaid Other | Admitting: Anesthesiology

## 2017-09-23 ENCOUNTER — Encounter (HOSPITAL_COMMUNITY)
Admission: AD | Disposition: A | Discharge status: Home / Self Care | Payer: Self-pay | Source: Ambulatory Visit | Attending: Obstetrics & Gynecology

## 2017-09-23 DIAGNOSIS — Z3A38 38 weeks gestation of pregnancy: Secondary | ICD-10-CM

## 2017-09-23 DIAGNOSIS — O4212 Full-term premature rupture of membranes, onset of labor more than 24 hours following rupture: Secondary | ICD-10-CM

## 2017-09-23 DIAGNOSIS — Z98891 History of uterine scar from previous surgery: Secondary | ICD-10-CM

## 2017-09-23 DIAGNOSIS — O99824 Streptococcus B carrier state complicating childbirth: Secondary | ICD-10-CM

## 2017-09-23 LAB — RPR: RPR Ser Ql: NONREACTIVE

## 2017-09-23 SURGERY — Surgical Case
Anesthesia: Spinal

## 2017-09-23 MED ORDER — MEPERIDINE HCL 25 MG/ML IJ SOLN
INTRAMUSCULAR | Status: DC | PRN
  Administered 2017-09-23 (×2): 12.5 mg via INTRAVENOUS

## 2017-09-23 MED ORDER — FENTANYL CITRATE (PF) 100 MCG/2ML IJ SOLN
INTRAMUSCULAR | Status: AC
  Filled 2017-09-23: qty 2

## 2017-09-23 MED ORDER — BUPIVACAINE HCL (PF) 0.5 % IJ SOLN
INTRAMUSCULAR | Status: DC | PRN
  Administered 2017-09-23: 30 mL

## 2017-09-23 MED ORDER — PHENYLEPHRINE 8 MG IN D5W 100 ML (0.08MG/ML) PREMIX OPTIME
INJECTION | INTRAVENOUS | Status: AC
  Filled 2017-09-23: qty 100

## 2017-09-23 MED ORDER — LACTATED RINGERS IV SOLN
INTRAVENOUS | Status: DC
  Administered 2017-09-23: 150 mL via INTRAUTERINE

## 2017-09-23 MED ORDER — KETOROLAC TROMETHAMINE 30 MG/ML IJ SOLN: 30.0000 mg | Freq: Four times a day (QID) | INTRAMUSCULAR | Status: AC | PRN

## 2017-09-23 MED ORDER — PHENYLEPHRINE 8 MG IN D5W 100 ML (0.08MG/ML) PREMIX OPTIME
INJECTION | INTRAVENOUS | Status: DC | PRN
  Administered 2017-09-23: 40 ug/min via INTRAVENOUS

## 2017-09-23 MED ORDER — MORPHINE SULFATE (PF) 0.5 MG/ML IJ SOLN
INTRAMUSCULAR | Status: AC
  Filled 2017-09-23: qty 10

## 2017-09-23 MED ORDER — DEXAMETHASONE SODIUM PHOSPHATE 10 MG/ML IJ SOLN
INTRAMUSCULAR | Status: AC
  Filled 2017-09-23: qty 1

## 2017-09-23 MED ORDER — ONDANSETRON HCL 4 MG/2ML IJ SOLN
INTRAMUSCULAR | Status: DC | PRN
  Administered 2017-09-23: 4 mg via INTRAVENOUS

## 2017-09-23 MED ORDER — DEXAMETHASONE SODIUM PHOSPHATE 10 MG/ML IJ SOLN
INTRAMUSCULAR | Status: DC | PRN
  Administered 2017-09-23: 10 mg via INTRAVENOUS

## 2017-09-23 MED ORDER — OXYTOCIN 10 UNIT/ML IJ SOLN
INTRAMUSCULAR | Status: AC
  Filled 2017-09-23: qty 4

## 2017-09-23 MED ORDER — LACTATED RINGERS IV SOLN
INTRAVENOUS | Status: DC | PRN
  Administered 2017-09-23: 21:00:00 via INTRAVENOUS

## 2017-09-23 MED ORDER — MEPERIDINE HCL 25 MG/ML IJ SOLN
INTRAMUSCULAR | Status: AC
  Filled 2017-09-23: qty 1

## 2017-09-23 MED ORDER — BUPIVACAINE IN DEXTROSE 0.75-8.25 % IT SOLN
INTRATHECAL | Status: DC | PRN
  Administered 2017-09-23: 1.6 mL via INTRATHECAL

## 2017-09-23 MED ORDER — LACTATED RINGERS IV SOLN
INTRAVENOUS | Status: DC | PRN
  Administered 2017-09-23: 22:00:00 via INTRAVENOUS

## 2017-09-23 MED ORDER — DIPHENHYDRAMINE HCL 50 MG/ML IJ SOLN
INTRAMUSCULAR | Status: AC
  Filled 2017-09-23: qty 1

## 2017-09-23 MED ORDER — CEFAZOLIN SODIUM-DEXTROSE 2-3 GM-%(50ML) IV SOLR
INTRAVENOUS | Status: DC | PRN
  Administered 2017-09-23: 2 g via INTRAVENOUS

## 2017-09-23 MED ORDER — DIPHENHYDRAMINE HCL 50 MG/ML IJ SOLN
INTRAMUSCULAR | Status: DC | PRN
  Administered 2017-09-23: 25 mg via INTRAVENOUS

## 2017-09-23 MED ORDER — MORPHINE SULFATE (PF) 0.5 MG/ML IJ SOLN
INTRAMUSCULAR | Status: DC | PRN
  Administered 2017-09-23: .2 mg via INTRATHECAL

## 2017-09-23 MED ORDER — KETOROLAC TROMETHAMINE 30 MG/ML IJ SOLN
30.0000 mg | Freq: Four times a day (QID) | INTRAMUSCULAR | Status: AC | PRN
  Administered 2017-09-24: 30 mg via INTRAMUSCULAR

## 2017-09-23 MED ORDER — PHENYLEPHRINE 40 MCG/ML (10ML) SYRINGE FOR IV PUSH (FOR BLOOD PRESSURE SUPPORT)
PREFILLED_SYRINGE | INTRAVENOUS | Status: AC
  Filled 2017-09-23: qty 10

## 2017-09-23 MED ORDER — SCOPOLAMINE 1 MG/3DAYS TD PT72
MEDICATED_PATCH | TRANSDERMAL | Status: DC | PRN
  Administered 2017-09-23: 1 via TRANSDERMAL

## 2017-09-23 MED ORDER — SCOPOLAMINE 1 MG/3DAYS TD PT72
MEDICATED_PATCH | TRANSDERMAL | Status: AC
  Filled 2017-09-23: qty 1

## 2017-09-23 MED ORDER — LACTATED RINGERS IV SOLN
INTRAVENOUS | Status: DC | PRN
  Administered 2017-09-23: 40 [IU] via INTRAVENOUS

## 2017-09-23 MED ORDER — ONDANSETRON HCL 4 MG/2ML IJ SOLN
INTRAMUSCULAR | Status: AC
  Filled 2017-09-23: qty 2

## 2017-09-23 MED ORDER — BUPIVACAINE HCL (PF) 0.5 % IJ SOLN
INTRAMUSCULAR | Status: AC
  Filled 2017-09-23: qty 30

## 2017-09-23 MED ORDER — EPHEDRINE 5 MG/ML INJ
INTRAVENOUS | Status: AC
  Filled 2017-09-23: qty 10

## 2017-09-23 SURGICAL SUPPLY — 30 items
BARRIER ADHS 3X4 INTERCEED (GAUZE/BANDAGES/DRESSINGS) IMPLANT
CHLORAPREP W/TINT 26ML (MISCELLANEOUS) ×3 IMPLANT
CLAMP CORD UMBIL (MISCELLANEOUS) IMPLANT
CLOTH BEACON ORANGE TIMEOUT ST (SAFETY) ×3 IMPLANT
DRSG OPSITE POSTOP 4X10 (GAUZE/BANDAGES/DRESSINGS) ×3 IMPLANT
ELECT REM PT RETURN 9FT ADLT (ELECTROSURGICAL) ×3
ELECTRODE REM PT RTRN 9FT ADLT (ELECTROSURGICAL) ×1 IMPLANT
EXTRACTOR VACUUM KIWI (MISCELLANEOUS) IMPLANT
GLOVE BIO SURGEON STRL SZ 6.5 (GLOVE) ×2 IMPLANT
GLOVE BIO SURGEONS STRL SZ 6.5 (GLOVE) ×1
GLOVE BIOGEL PI IND STRL 7.0 (GLOVE) ×2 IMPLANT
GLOVE BIOGEL PI INDICATOR 7.0 (GLOVE) ×4
GOWN STRL REUS W/TWL LRG LVL3 (GOWN DISPOSABLE) ×6 IMPLANT
KIT ABG SYR 3ML LUER SLIP (SYRINGE) IMPLANT
NEEDLE HYPO 22GX1.5 SAFETY (NEEDLE) IMPLANT
NEEDLE HYPO 25X5/8 SAFETYGLIDE (NEEDLE) IMPLANT
NS IRRIG 1000ML POUR BTL (IV SOLUTION) ×3 IMPLANT
PACK C SECTION WH (CUSTOM PROCEDURE TRAY) ×3 IMPLANT
PAD OB MATERNITY 4.3X12.25 (PERSONAL CARE ITEMS) ×3 IMPLANT
PENCIL SMOKE EVAC W/HOLSTER (ELECTROSURGICAL) ×3 IMPLANT
RETRACTOR WND ALEXIS 25 LRG (MISCELLANEOUS) IMPLANT
RTRCTR WOUND ALEXIS 25CM LRG (MISCELLANEOUS)
SUT PLAIN 2 0 XLH (SUTURE) ×3 IMPLANT
SUT VIC AB 0 CT1 36 (SUTURE) ×18 IMPLANT
SUT VIC AB 2-0 CT1 27 (SUTURE) ×2
SUT VIC AB 2-0 CT1 TAPERPNT 27 (SUTURE) ×1 IMPLANT
SUT VIC AB 4-0 PS2 27 (SUTURE) ×3 IMPLANT
SYR CONTROL 10ML LL (SYRINGE) IMPLANT
TOWEL OR 17X24 6PK STRL BLUE (TOWEL DISPOSABLE) ×3 IMPLANT
TRAY FOLEY BAG SILVER LF 14FR (SET/KITS/TRAYS/PACK) IMPLANT

## 2017-09-23 NOTE — Anesthesia Preprocedure Evaluation (Signed)
Anesthesia Evaluation  Patient identified by MRN, date of birth, ID band Patient awake    Reviewed: Allergy & Precautions, NPO status , Patient's Chart, lab work & pertinent test results  Airway Mallampati: I       Dental  (+) Teeth Intact, Dental Advisory Given   Pulmonary    Pulmonary exam normal        Cardiovascular negative cardio ROS   Rhythm:Regular Rate:Normal     Neuro/Psych  Headaches, Anxiety    GI/Hepatic negative GI ROS, Neg liver ROS,   Endo/Other  negative endocrine ROS  Renal/GU negative Renal ROS  negative genitourinary   Musculoskeletal negative musculoskeletal ROS (+)   Abdominal (+) + obese,   Peds  Hematology negative hematology ROS (+)   Anesthesia Other Findings   Reproductive/Obstetrics (+) Pregnancy                             Anesthesia Physical Anesthesia Plan  ASA: III  Anesthesia Plan: Spinal   Post-op Pain Management:    Induction:   PONV Risk Score and Plan: Ondansetron  Airway Management Planned:   Additional Equipment:   Intra-op Plan:   Post-operative Plan:   Informed Consent: I have reviewed the patients History and Physical, chart, labs and discussed the procedure including the risks, benefits and alternatives for the proposed anesthesia with the patient or authorized representative who has indicated his/her understanding and acceptance.     Plan Discussed with:   Anesthesia Plan Comments: (Lab Results      Component                Value               Date                      WBC                      9.6                 09/22/2017                HGB                      11.8 (L)            09/22/2017                HCT                      35.9 (L)            09/22/2017                MCV                      95.7                09/22/2017                PLT                      235                 09/22/2017           )         Anesthesia Quick Evaluation

## 2017-09-23 NOTE — Progress Notes (Signed)
Labor Progress Note Kelsey Levy is a 26 y.o. G1P0 at 8115w2d presented for PROM. S: No complaints  O:  BP (!) 91/48   Pulse 90   Temp 98.1 F (36.7 C) (Oral)   Resp 18   Ht 5\' 6"  (1.676 m)   Wt 250 lb (113.4 kg)   LMP 12/26/2016 (Approximate)   SpO2 98%   BMI 40.35 kg/m  EFM: 150 bpm/mod var/late vs early variable decels/ pos acels  CVE: Dilation: 6 Effacement (%): 70 Cervical Position: Middle Station: 0 Presentation: Vertex Exam by:: Dr. Rachelle HoraMoss   A&P: 26 y.o. G1P0 9015w2d here for PROM. #Labor: IUPC in place. Reduce pitocin. Then continue to increase pitocin. Currently adequate on pitocin. #FWB: cat 1   Kelsey Sakata, DO 6:00 AM

## 2017-09-23 NOTE — Progress Notes (Signed)
Kelsey ArmsBrooke Levy is a 26 y.o. G1P0 at 1962w2d by ultrasound admitted for induction of labor due to Spontaneous rupture of BOW.  Subjective:   Objective: BP (!) 119/58   Pulse 92   Temp 98.1 F (36.7 C) (Oral)   Resp 18   Ht 5\' 6"  (1.676 m)   Wt 250 lb (113.4 kg)   LMP 12/26/2016 (Approximate)   SpO2 98%   BMI 40.35 kg/m  No intake/output data recorded. No intake/output data recorded.  FHT:  FHR: 120 bpm, variability: moderate,  accelerations:  Present,  decelerations:  Present early UC:   regular, every 3-4 minutes SVE:   Dilation: 6 Effacement (%): 70 Station: -2 Exam by:: Jacobs Engineeringrnold  Labs: Lab Results  Component Value Date   WBC 9.6 09/22/2017   HGB 11.8 (L) 09/22/2017   HCT 35.9 (L) 09/22/2017   MCV 95.7 09/22/2017   PLT 235 09/22/2017    Assessment / Plan: Induction of labor due to PROM,  progressing well on pitocin  Labor: yet to be in labor Preeclampsia:  no signs or symptoms of toxicity Fetal Wellbeing:  Category II Pain Control:  Labor support without medications I/D:  n/a Anticipated MOD:  NSVD  Kelsey Levy 09/23/2017, 5:15 PM

## 2017-09-23 NOTE — Progress Notes (Signed)
Kelsey ArmsBrooke Levy is a 26 y.o. G1P0 at 2774w2d by ultrasound admitted for rupture of membranes  Subjective:   Objective: BP (!) 104/49   Pulse 83   Temp 98.3 F (36.8 C) (Oral)   Resp 16   Ht 5\' 6"  (1.676 m)   Wt 250 lb (113.4 kg)   LMP 12/26/2016 (Approximate)   SpO2 98%   BMI 40.35 kg/m  No intake/output data recorded. No intake/output data recorded.  FHT:  FHR: 120's bpm, variability: moderate,  accelerations:  Present,  decelerations:  Absent UC:   regular, every 3 minutes SVE:   Dilation: 6 Effacement (%): 70 Station: 0 Exam by:: Dr. Rachelle HoraMoss  Labs: Lab Results  Component Value Date   WBC 9.6 09/22/2017   HGB 11.8 (L) 09/22/2017   HCT 35.9 (L) 09/22/2017   MCV 95.7 09/22/2017   PLT 235 09/22/2017    Assessment / Plan: Induction of labor due to PROM,  progressing well on pitocin  Labor: Progressing normally Preeclampsia:  no signs or symptoms of toxicity Fetal Wellbeing:  Category I Pain Control:  Labor support without medications I/D:  n/a Anticipated MOD:  NSVD  Kelsey Levy 09/23/2017, 9:11 AM

## 2017-09-23 NOTE — Progress Notes (Signed)
Dr. Debroah LoopArnold at bedside for cervical exam. Discussed with patient need to proceed with primary cesarean section for arrest of dilation and active phase of labor. Risks and benefits reviewed and consents signed. Will prepare for OR.

## 2017-09-23 NOTE — Progress Notes (Signed)
Labor Progress Note Kelsey Levy is a 26 y.o. G1P0 at 2948w2d presented for here for PROM. S: No complaints  O:  BP (!) 112/46   Pulse 87   Temp 98.3 F (36.8 C) (Oral)   Resp 19   Ht 5\' 6"  (1.676 m)   Wt 250 lb (113.4 kg)   LMP 12/26/2016 (Approximate)   SpO2 98%   BMI 40.35 kg/m  EFM: 120 bpm/mod var/pos acels  CVE: Dilation: 5 Effacement (%): 70 Cervical Position: Middle Station: -2 Presentation: Vertex Exam by:: Dr. Rachelle HoraMoss   A&P: 26 y.o. G1P0 1048w2d here for PROM. #Labor: Continue pitocin. Anticipate SVD.  #FWB: cat 1   Kelsey Keys, DO 2:07 AM

## 2017-09-23 NOTE — Anesthesia Procedure Notes (Signed)
Spinal  Patient location during procedure: OR Start time: 09/23/2017 9:25 PM End time: 09/23/2017 9:31 PM Staffing Anesthesiologist: Hollis, Kevin D, MD Performed: anesthesiologist  Preanesthetic Checklist Completed: patient identified, site marked, surgical consent, pre-op evaluation, timeout performed, IV checked, risks and benefits discussed and monitors and equipment checked Spinal Block Patient position: sitting Prep: Betadine Patient monitoring: heart rate, continuous pulse ox, blood pressure and cardiac monitor Approach: midline Location: L4-5 Injection technique: single-shot Needle Needle type: Introducer  Needle gauge: 24 G Needle length: 9 cm Additional Notes Negative paresthesia. Negative blood return. Positive free-flowing CSF. Expiration date of kit checked and confirmed. Patient tolerated procedure well, without complications.  CSE technique, no epidural placed.      

## 2017-09-23 NOTE — Transfer of Care (Signed)
Immediate Anesthesia Transfer of Care Note  Patient: Kelsey Levy  Procedure(s) Performed: CESAREAN SECTION (N/A )  Patient Location: PACU  Anesthesia Type:Spinal  Level of Consciousness: awake, alert  and oriented  Airway & Oxygen Therapy: Patient Spontanous Breathing  Post-op Assessment: Report given to RN and Post -op Vital signs reviewed and stable  Post vital signs: Reviewed and stable  Last Vitals:  Vitals:   09/23/17 2003 09/23/17 2041  BP: 134/61 (!) 157/74  Pulse: (!) 125 (!) 121  Resp: 20 18  Temp: 36.6 C   SpO2: 100%     Last Pain:  Vitals:   09/23/17 2003  TempSrc: Axillary  PainSc:       Patients Stated Pain Goal: 0 (09/22/17 1257)  Complications: No apparent anesthesia complications

## 2017-09-23 NOTE — Progress Notes (Signed)
Kelsey Levy is a 26 y.o. G1P0 at 264w2d by ultrasound admitted for induction of labor due to PROM  Subjective:   Objective: BP 124/73   Pulse 82   Temp 99.4 F (37.4 C) (Oral)   Resp 18   Ht 5\' 6"  (1.676 m)   Wt 250 lb (113.4 kg)   LMP 12/26/2016 (Approximate)   SpO2 98%   BMI 40.35 kg/m  No intake/output data recorded. No intake/output data recorded.  FHT:  FHR: 120 bpm, variability: moderate,  accelerations:  Present,  decelerations:  Present early UC:   regular, every 3 minutes SVE:   Dilation: 5 Effacement (%): 70 Station: -2 Exam by:: Zerita Boersarlene Zaiden Ludlum, CNM  Labs: Lab Results  Component Value Date   WBC 9.6 09/22/2017   HGB 11.8 (L) 09/22/2017   HCT 35.9 (L) 09/22/2017   MCV 95.7 09/22/2017   PLT 235 09/22/2017    Assessment / Plan: Induction of labor due to PROM,  progressing well on pitocin  Labor: Progressing normally Preeclampsia:  no signs or symptoms of toxicity Fetal Wellbeing:  Category II Pain Control:  Labor support without medications I/D:  n/a Anticipated MOD:  NSVD  Wyvonnia DuskyMarie Dannon Nguyenthi 09/23/2017, 2:43 PM

## 2017-09-23 NOTE — Progress Notes (Signed)
Patient ID: Kelsey Levy, female   DOB: 06/23/1991, 26 y.o.   MRN: [id number] No cervical change by my exam. I offered cesarean section for active stage arrest at 6 cm. The risks of cesarean section discussed with the patient included but were not limited to: bleeding which may require transfusion or reoperation; infection which may require antibiotics; injury to bowel, bladder, ureters or other surrounding organs; injury to the fetus; need for additional procedures including hysterectomy in the event of a life-threatening hemorrhage; placental abnormalities wth subsequent pregnancies, incisional problems, thromboembolic phenomenon and other postoperative/anesthesia complications. The patient concurred with the proposed plan, giving informed written consent for the procedure.   Patient has been NPO since yesterday she will remain NPO for procedure. Anesthesia and OR aware. Preoperative prophylactic antibiotics and SCDs ordered on call to the OR.  To OR when ready.  Adam PhenixArnold, James G, MD 09/23/2017 8:34 PM

## 2017-09-23 NOTE — Op Note (Signed)
Cesarean Section Operative Report  PATIENT: Kelsey Levy  PROCEDURE DATE: 09/23/2017  PREOPERATIVE DIAGNOSES: Intrauterine pregnancy at 6358w2d weeks gestation; Arrest of dilation   POSTOPERATIVE DIAGNOSES: The same  PROCEDURE: Primary Low Transverse Cesarean Section  SURGEON:   Surgeon(s) and Role:    * Adam PhenixArnold, James G, MD - Primary    * Degele, Kandra NicolasJulie P, MD - OB Fellow   INDICATIONS: Kelsey Levy is a 26 y.o. G1P0 at 6858w2d here for cesarean section secondary to the indications listed under preoperative diagnoses; please see preoperative note for further details.  The risks of cesarean section were discussed with the patient including but were not limited to: bleeding which may require transfusion or reoperation; infection which may require antibiotics; injury to bowel, bladder, ureters or other surrounding organs; injury to the fetus; need for additional procedures including hysterectomy in the event of a life-threatening hemorrhage; placental abnormalities wth subsequent pregnancies, incisional problems, thromboembolic phenomenon and other postoperative/anesthesia complications.   The patient concurred with the proposed plan, giving informed written consent for the procedure.    FINDINGS:  Viable female infant in cephalic presentation.  Apgars 9 and 9.  Clear amniotic fluid.  Intact placenta, three vessel cord.  Normal uterus, fallopian tubes and ovaries bilaterally.  ANESTHESIA: Spinal INTRAVENOUS FLUIDS: 1500 ml ESTIMATED BLOOD LOSS: 884 mL  URINE OUTPUT:  150 ml SPECIMENS: Placenta sent to L&D COMPLICATIONS: None immediate  PROCEDURE IN DETAIL:  The patient preoperatively received intravenous antibiotics and had sequential compression devices applied to her lower extremities.  She was then taken to the operating room where spinal anesthesia was administered and was found to be adequate. She was then placed in a dorsal supine position with a leftward tilt, and prepped and  draped in a sterile manner.  A foley catheter was placed into her bladder and attached to constant gravity.    After an adequate timeout was performed, a Pfannenstiel skin incision was made with scalpel and carried through to the underlying layer of fascia. The fascia was incised in the midline, and this incision was extended bilaterally using the Mayo scissors.  Kocher clamps were applied to the superior aspect of the fascial incision and the underlying rectus muscles were dissected off bluntly.  A similar process was carried out on the inferior aspect of the fascial incision. The rectus muscles were separated in the midline bluntly and the peritoneum was entered bluntly. Attention was turned to the lower uterine segment where a low transverse hysterotomy was made with a scalpel and extended bilaterally bluntly.  The infant was successfully delivered, the cord was clamped and cut after one minute, and the infant was handed over to the awaiting neonatology team. Uterine massage was then administered, and the placenta delivered intact with a three-vessel cord. The uterus was then cleared of clots and debris.  The hysterotomy was closed with 0 Vicryl in a running locked fashion, and an imbricating layer was also placed with 0 Vicryl.  A figure-of-eight 0 Vicryl serosal stitch were placed to help with hemostasis.  The pelvis was cleared of all clot and debris. Hemostasis was confirmed on all surfaces.  The peritoneum was closed with a 0 Vicryl running stitch and the rectus muscles were reapproximated using 2-0 Vicryl. The fascia was then closed using 0 Vicryl in a running fashion.  The subcutaneous layer was reapproximated with 2-0 plain gut interrupted stitches, and 30 ml of 0.5% Marcaine was injected subcutaneously around the incision.  The skin was closed with a 4-0 Vicryl  subcuticular stitch.   The patient tolerated the procedure well. Sponge, lap, instrument and needle counts were correct x 3.  She was taken  to the recovery room in stable condition.    Disposition: PACU - hemodynamically stable.   Maternal Condition: stable    Signed: Frederik PearJulie P Degele, MD OB Fellow 09/23/2017 10:58 PM

## 2017-09-23 NOTE — Progress Notes (Signed)
Kelsey ArmsBrooke Levy is a 26 y.o. G1P0 at 1258w2d admitted for rupture of membranes  Subjective:satisfied with pain control   Objective: BP (!) 119/58   Pulse 92   Temp 98.1 F (36.7 C) (Oral)   Resp 18   Ht 5\' 6"  (1.676 m)   Wt 113.4 kg (250 lb)   LMP 12/26/2016 (Approximate)   SpO2 98%   BMI 40.35 kg/m  No intake/output data recorded. No intake/output data recorded.  FHT: Mode Fetal scalp electrode filed at 09/23/2017 0831  Baseline Rate (A) 125 bpm filed at 09/23/2017 1704  Variability 6-25 BPM filed at 09/23/2017 1704  Accelerations 15 x 15 filed at 09/23/2017 1704  Decelerations Variable, Late filed at 09/23/2017 1704  Scalp Stimulation Positive filed at 09/23/2017 1453    UC:   irregular, every 3-4 minutes SVE:   6/80/-2/v  Labs: Lab Results  Component Value Date   WBC 9.6 09/22/2017   HGB 11.8 (L) 09/22/2017   HCT 35.9 (L) 09/22/2017   MCV 95.7 09/22/2017   PLT 235 09/22/2017    Assessment / Plan: Protracted active phase  Labor: Progressing normally Preeclampsia:  no signs or symptoms of toxicity Fetal Wellbeing:  Category II Pain Control:  IV pain meds I/D:  n/a Anticipated MOD:  NSVD  Kelsey Levy 09/23/2017, 5:08 PM

## 2017-09-24 ENCOUNTER — Encounter (HOSPITAL_COMMUNITY): Payer: Self-pay | Admitting: Obstetrics & Gynecology

## 2017-09-24 LAB — CBC
HCT: 29.8 % — ABNORMAL LOW (ref 36.0–46.0)
HEMOGLOBIN: 9.9 g/dL — AB (ref 12.0–15.0)
MCH: 31.5 pg (ref 26.0–34.0)
MCHC: 33.2 g/dL (ref 30.0–36.0)
MCV: 94.9 fL (ref 78.0–100.0)
Platelets: 193 10*3/uL (ref 150–400)
RBC: 3.14 MIL/uL — AB (ref 3.87–5.11)
RDW: 13.3 % (ref 11.5–15.5)
WBC: 12.8 10*3/uL — AB (ref 4.0–10.5)

## 2017-09-24 MED ORDER — KETOROLAC TROMETHAMINE 30 MG/ML IJ SOLN
INTRAMUSCULAR | Status: AC
  Filled 2017-09-24: qty 1

## 2017-09-24 MED ORDER — COCONUT OIL OIL: 1.0000 "application " | TOPICAL_OIL | Status: DC | PRN

## 2017-09-24 MED ORDER — COMPLETENATE 29-1 MG PO CHEW
1.0000 | CHEWABLE_TABLET | Freq: Every day | ORAL | Status: DC
  Administered 2017-09-24 – 2017-09-26 (×3): 1 via ORAL
  Filled 2017-09-24 (×4): qty 1

## 2017-09-24 MED ORDER — SIMETHICONE 80 MG PO CHEW
80.0000 mg | CHEWABLE_TABLET | ORAL | Status: DC
  Administered 2017-09-25 – 2017-09-26 (×2): 80 mg via ORAL
  Filled 2017-09-24 (×2): qty 1

## 2017-09-24 MED ORDER — PRENATAL MULTIVITAMIN CH
1.0000 | ORAL_TABLET | Freq: Every day | ORAL | Status: DC
  Filled 2017-09-24: qty 1

## 2017-09-24 MED ORDER — WITCH HAZEL-GLYCERIN EX PADS: 1.0000 "application " | MEDICATED_PAD | CUTANEOUS | Status: DC | PRN

## 2017-09-24 MED ORDER — ACETAMINOPHEN 325 MG PO TABS
650.0000 mg | ORAL_TABLET | ORAL | Status: DC | PRN
Start: 2017-09-24 — End: 2017-09-26

## 2017-09-24 MED ORDER — IBUPROFEN 600 MG PO TABS
600.0000 mg | ORAL_TABLET | Freq: Four times a day (QID) | ORAL | Status: DC
  Administered 2017-09-24: 600 mg via ORAL
  Filled 2017-09-24 (×2): qty 1

## 2017-09-24 MED ORDER — ENOXAPARIN SODIUM 40 MG/0.4ML ~~LOC~~ SOLN
40.0000 mg | SUBCUTANEOUS | Status: DC
  Administered 2017-09-24 – 2017-09-26 (×3): 40 mg via SUBCUTANEOUS
  Filled 2017-09-24 (×4): qty 0.4

## 2017-09-24 MED ORDER — TETANUS-DIPHTH-ACELL PERTUSSIS 5-2.5-18.5 LF-MCG/0.5 IM SUSP: 0.5000 mL | Freq: Once | INTRAMUSCULAR | Status: DC

## 2017-09-24 MED ORDER — MENTHOL 3 MG MT LOZG: 1.0000 | LOZENGE | OROMUCOSAL | Status: DC | PRN

## 2017-09-24 MED ORDER — LACTATED RINGERS IV SOLN
INTRAVENOUS | Status: DC
  Administered 2017-09-24: 05:00:00 via INTRAVENOUS

## 2017-09-24 MED ORDER — IBUPROFEN 100 MG/5ML PO SUSP
600.0000 mg | Freq: Four times a day (QID) | ORAL | Status: DC
  Administered 2017-09-24: 600 mg via ORAL
  Administered 2017-09-25: 20 mg via ORAL
  Administered 2017-09-25: 600 mg via ORAL
  Administered 2017-09-25: 20 mg via ORAL
  Administered 2017-09-25: 600 mg via ORAL
  Administered 2017-09-25: 20 mg via ORAL
  Administered 2017-09-26: 600 mg via ORAL
  Filled 2017-09-24 (×12): qty 30

## 2017-09-24 MED ORDER — SIMETHICONE 80 MG PO CHEW
80.0000 mg | CHEWABLE_TABLET | Freq: Three times a day (TID) | ORAL | Status: DC
  Administered 2017-09-24 – 2017-09-26 (×5): 80 mg via ORAL
  Filled 2017-09-24 (×6): qty 1

## 2017-09-24 MED ORDER — OXYTOCIN 40 UNITS IN LACTATED RINGERS INFUSION - SIMPLE MED: 2.5000 [IU]/h | INTRAVENOUS | Status: AC

## 2017-09-24 MED ORDER — OXYCODONE HCL 5 MG PO TABS
10.0000 mg | ORAL_TABLET | ORAL | Status: DC | PRN
  Administered 2017-09-25 – 2017-09-26 (×3): 10 mg via ORAL
  Filled 2017-09-24 (×3): qty 2

## 2017-09-24 MED ORDER — OXYCODONE HCL 5 MG PO TABS
5.0000 mg | ORAL_TABLET | ORAL | Status: DC | PRN
  Administered 2017-09-24 – 2017-09-25 (×3): 5 mg via ORAL
  Filled 2017-09-24 (×3): qty 1

## 2017-09-24 MED ORDER — DIPHENHYDRAMINE HCL 25 MG PO CAPS
25.0000 mg | ORAL_CAPSULE | Freq: Four times a day (QID) | ORAL | Status: DC | PRN
Start: 2017-09-24 — End: 2017-09-26

## 2017-09-24 MED ORDER — SIMETHICONE 80 MG PO CHEW: 80.0000 mg | CHEWABLE_TABLET | ORAL | Status: DC | PRN

## 2017-09-24 MED ORDER — SENNOSIDES-DOCUSATE SODIUM 8.6-50 MG PO TABS
2.0000 | ORAL_TABLET | ORAL | Status: DC
  Administered 2017-09-25 – 2017-09-26 (×2): 2 via ORAL
  Filled 2017-09-24 (×2): qty 2

## 2017-09-24 MED ORDER — ZOLPIDEM TARTRATE 5 MG PO TABS: 5.0000 mg | ORAL_TABLET | Freq: Every evening | ORAL | Status: DC | PRN

## 2017-09-24 MED ORDER — DIBUCAINE 1 % RE OINT: 1.0000 "application " | TOPICAL_OINTMENT | RECTAL | Status: DC | PRN

## 2017-09-24 NOTE — Anesthesia Postprocedure Evaluation (Signed)
Anesthesia Post Note  Patient: Mara Haefner  Procedure(s) Performed: CESAREAN SECTION (N/A )     Patient location during evaluation: Mother Baby Anesthesia Type: Spinal Level of consciousness: awake, awake and alert and patient cooperative Pain management: pain level controlled Vital Signs Assessment: post-procedure vital signs reviewed and stable Respiratory status: spontaneous breathing, nonlabored ventilation and respiratory function stable Cardiovascular status: stable Postop Assessment: no headache, no backache, patient able to bend at knees and no apparent nausea or vomiting Anesthetic complications: no    Last Vitals:  Vitals:   09/24/17 0340 09/24/17 0457  BP: (!) 124/54   Pulse: 76   Resp: 20   Temp: 36.9 C 36.7 C  SpO2: 95% 95%    Last Pain:  Vitals:   09/24/17 0457  TempSrc: Oral  PainSc:    Pain Goal: Patients Stated Pain Goal: 0 (09/22/17 1257)               Adelyna Brockman L

## 2017-09-24 NOTE — Progress Notes (Signed)
CSW received consult for hx of marijuana use.  Referral was screened out due to the following: ~MOB had no documented substance use after initial prenatal visit/+UPT. ~MOB had no positive drug screens after initial prenatal visit/+UPT.  Please consult CSW if current concerns arise or by MOB's request.  CSW will monitor CDS results and make report to Child Protective Services if warranted.  Kayra Crowell Boyd-Gilyard, MSW, LCSW Clinical Social Work [phone]  

## 2017-09-24 NOTE — Progress Notes (Signed)
POSTPARTUM PROGRESS NOTE  Post Partum Day 1 Subjective:  Kelsey Levy is a 26 y.o. G1P1001 114w2d s/p pLTCS for arrest of dilation.  No acute events overnight.  Pt denies problems with ambulating, voiding or po intake.  She denies nausea or vomiting.  Pain is well controlled.  She has had flatus. She has not had bowel movement.  Lochia Minimal.   We discussed her low BP and she denies any clinical symptoms.   We discussed needing to maintiain oral intake  Objective: Blood pressure (!) 126/53, pulse 70, temperature 98.2 F (36.8 C), temperature source Oral, resp. rate 20, height 5\' 6"  (1.676 m), weight 113.4 kg (250 lb), last menstrual period 12/26/2016, SpO2 100 %, unknown if currently breastfeeding.  Physical Exam:  General: alert, cooperative and no distress Lochia:normal flow Chest: no respiratory distress Heart:regular rate, distal pulses intact Abdomen: soft, nontender,  Uterine Fundus: firm, appropriately tender DVT Evaluation: No calf swelling or tenderness Extremities: no edema  Recent Labs    09/22/17 1434 09/24/17 0549  HGB 11.8* 9.9*  HCT 35.9* 29.8*    Assessment/Plan:  ASSESSMENT: Kelsey Levy is a 26 y.o. G1P1001 254w2d s/p pLTCS for arrest of dilation  Plan for discharge tomorrow or 12/19, will monitor BP and encourage PO intake   LOS: 2 days   Clark Clowdus BlandDO 09/24/2017, 9:11 AM

## 2017-09-24 NOTE — Addendum Note (Signed)
Addendum  created 09/24/17 0737 by Yolonda Kidaarver, Sydnie Sigmund L, CRNA   Sign clinical note

## 2017-09-24 NOTE — Anesthesia Postprocedure Evaluation (Signed)
Anesthesia Post Note  Patient: Kelsey Levy  Procedure(s) Performed: CESAREAN SECTION (N/A )     Patient location during evaluation: PACU Anesthesia Type: Spinal Level of consciousness: oriented and awake and alert Pain management: pain level controlled Vital Signs Assessment: post-procedure vital signs reviewed and stable Respiratory status: spontaneous breathing, respiratory function stable and patient connected to nasal cannula oxygen Cardiovascular status: blood pressure returned to baseline and stable Postop Assessment: no headache, no backache, no apparent nausea or vomiting, spinal receding and patient able to bend at knees Anesthetic complications: no    Last Vitals:  Vitals:   09/24/17 0015 09/24/17 0036  BP: 125/69 (!) 107/47  Pulse: 80 88  Resp: (!) 30 18  Temp: 36.6 C 36.6 C  SpO2: 100% 96%    Last Pain:  Vitals:   09/24/17 0036  TempSrc: Oral  PainSc:    Pain Goal: Patients Stated Pain Goal: 0 (09/22/17 1257)               Shelton SilvasKevin D Hollis

## 2017-09-25 MED ORDER — IBUPROFEN 100 MG/5ML PO SUSP: 600.0000 mg | Freq: Four times a day (QID) | ORAL | 0 refills | Status: AC

## 2017-09-25 MED ORDER — OXYCODONE HCL 5 MG PO TABS: 5.0000 mg | ORAL_TABLET | ORAL | 0 refills | Status: AC | PRN

## 2017-09-25 NOTE — Discharge Summary (Signed)
OB Discharge Summary     Patient Name: Kelsey Levy DOB: 09/11/1991 MRN: [id number]  Date of admission: 09/22/2017 Delivering MD: Kelsey PearEGELE, Ethell Blatchford P   Date of discharge: 09/25/2017  Admitting diagnosis: 38 WKS, WATER BROKE Intrauterine pregnancy: 2744w2d     Secondary diagnosis:  Principal Problem:   Status post primary low transverse cesarean section Active Problems:   PROM (premature rupture of membranes)   Arrest of dilation, delivered, current hospitalization  Additional problems:      Discharge diagnosis: Term Pregnancy Delivered                                                                                                Post partum procedures:n/a  Augmentation: Pitocin and Cytotec  [facility]: [facility]  [facility] course:  Induction of Labor With Cesarean Section  26 y.o. yo G1P1001 at 944w2d was admitted to the hospital 09/22/2017 for induction of labor. Patient had a labor course significant for arrest of dilation. The patient went for cesarean section due to Arrest of Dilation, and delivered a Viable infant. Membrane Rupture Time/Date: )5:00 AM ,09/22/2017   @Details  of operation can be found in separate operative Note.  Patient had an uncomplicated postpartum course. She is ambulating, tolerating a regular diet, passing flatus, and urinating well.  Patient is discharged home in stable condition on 09/25/17.                                    Physical exam  Vitals:   09/24/17 1230 09/24/17 1430 09/24/17 1730 09/25/17 0459  BP: (!) 142/66  (!) 116/52 (!) 129/54  Pulse: 84  72 88  Resp: 18  19 18   Temp: 97.7 F (36.5 C)  97.8 F (36.6 C) (!) 97.4 F (36.3 C)  TempSrc: Oral  Oral Oral  SpO2: 94% 98% 100%   Weight:      Height:       General: alert, cooperative and no distress Lochia: appropriate Uterine Fundus: firm Incision: Healing well with no significant drainage DVT Evaluation: No evidence of DVT seen on physical  exam. Labs: Lab Results  Component Value Date   WBC 12.8 (H) 09/24/2017   HGB 9.9 (L) 09/24/2017   HCT 29.8 (L) 09/24/2017   MCV 94.9 09/24/2017   PLT 193 09/24/2017   No flowsheet data found.  Discharge instruction: per After Visit Summary and "Baby and Me Booklet".  After visit meds:  Allergies as of 09/25/2017      Reactions   Latex Anaphylaxis      Medication List    TAKE these medications   acetaminophen 325 MG tablet Commonly known as:  TYLENOL Take 650 mg by mouth every 6 (six) hours as needed for moderate pain.   calcium carbonate 1250 (500 Ca) MG chewable tablet Commonly known as:  OS-CAL Chew 1 tablet by mouth daily.   ibuprofen 100 MG/5ML suspension Commonly known as:  ADVIL,MOTRIN Take 30 mLs (600 mg total) by mouth every 6 (six) hours.   oxyCODONE 5  MG immediate release tablet Commonly known as:  Oxy IR/ROXICODONE Take 1 tablet (5 mg total) by mouth every 4 (four) hours as needed (pain scale 4-7).   PNV PO Take 1 tablet by mouth daily.       Diet: routine diet  Activity: Advance as tolerated. Pelvic rest for 6 weeks.   Outpatient follow up:2 weeks for inision check, 4wks for followup  Postpartum contraception: Progesterone only pills  Newborn Data: Live born female  Birth Weight: 6 lb 12.1 oz (3065 g) APGAR: 9, 9  Newborn Delivery   Birth date/time:  09/23/2017 22:00:00 Delivery type:  C-Section, Low Transverse C-section categorization:  Primary     Baby Feeding: Breast Disposition:home with mother   09/25/2017 Kelsey RollingScott Bland, DO  OB FELLOW DISCHARGE ATTESTATION  I have seen and examined this patient and agree with above documentation in the resident's note.   Kelsey PearJulie P Shiniqua Groseclose, MD OB Fellow

## 2017-09-25 NOTE — Progress Notes (Signed)
Discharge order removed. Patient staying another day due to baby needing to be monitored. Patient is POD#2. Plan for discharge tomorrorw.

## 2017-09-26 ENCOUNTER — Other Ambulatory Visit: Payer: Self-pay

## 2017-09-26 NOTE — Discharge Summary (Signed)
OB Discharge Summary     Patient Name: Kelsey ArmsBrooke Sosa DOB: 12/28/1990 MRN: [id number]  *Patient held additional day because baby was not being d/c'd, there are now two d/c notes for this encounter*  Date of admission: 09/22/2017 Delivering MD: Frederik PearEGELE, JULIE P   Date of discharge: 09/26/2017  Admitting diagnosis: 38 WKS, WATER BROKE Intrauterine pregnancy: 3710w2d     Secondary diagnosis:  Principal Problem:   Status post primary low transverse cesarean section Active Problems:   PROM (premature rupture of membranes)   Arrest of dilation, delivered, current hospitalization  Additional problems:      Discharge diagnosis: Term Pregnancy Delivered                                                                                                Post partum procedures:n/a  Augmentation: Pitocin and Cytotec  [facility]: [facility]  Hospital course:  Induction of Labor With Cesarean Section  26 y.o. yo G1P1001 at 510w2d was admitted to the hospital 09/22/2017 for induction of labor. Patient had a labor course significant for arrest of dilation. The patient went for cesarean section due to Arrest of Dilation, and delivered a Viable infant. Membrane Rupture Time/Date: )5:00 AM ,09/22/2017   @Details  of operation can be found in separate operative Note.  Patient had an uncomplicated postpartum course. She is ambulating, tolerating a regular diet, passing flatus, and urinating well.  Patient is discharged home in stable condition on 09/26/17.                                    Physical exam  Vitals:   09/24/17 1730 09/25/17 0459 09/25/17 1809 09/26/17 0552  BP: (!) 116/52 (!) 129/54 110/63 111/62  Pulse: 72 88 (!) 102 79  Resp: 19 18 18 18   Temp: 97.8 F (36.6 C) (!) 97.4 F (36.3 C) 99.1 F (37.3 C) 98.1 F (36.7 C)  TempSrc: Oral Oral Oral Oral  SpO2: 100%     Weight:      Height:       General: alert, cooperative and no distress Lochia:  appropriate Uterine Fundus: firm Incision: Healing well with no significant drainage DVT Evaluation: No evidence of DVT seen on physical exam. Labs: Lab Results  Component Value Date   WBC 12.8 (H) 09/24/2017   HGB 9.9 (L) 09/24/2017   HCT 29.8 (L) 09/24/2017   MCV 94.9 09/24/2017   PLT 193 09/24/2017   No flowsheet data found.  Discharge instruction: per After Visit Summary and "Baby and Me Booklet".  After visit meds:  Allergies as of 09/26/2017      Reactions   Latex Anaphylaxis      Medication List    TAKE these medications   acetaminophen 325 MG tablet Commonly known as:  TYLENOL Take 650 mg by mouth every 6 (six) hours as needed for moderate pain.   calcium carbonate 1250 (500 Ca) MG chewable tablet Commonly known as:  OS-CAL Chew 1 tablet by mouth daily.   ibuprofen 100  MG/5ML suspension Commonly known as:  ADVIL,MOTRIN Take 30 mLs (600 mg total) by mouth every 6 (six) hours.   oxyCODONE 5 MG immediate release tablet Commonly known as:  Oxy IR/ROXICODONE Take 1 tablet (5 mg total) by mouth every 4 (four) hours as needed (pain scale 4-7).   PNV PO Take 1 tablet by mouth daily.       Diet: routine diet  Activity: Advance as tolerated. Pelvic rest for 6 weeks.   Outpatient follow up:2 weeks for inision check, 4wks for followup  Postpartum contraception: Progesterone only pills  Newborn Data: Live born female  Birth Weight: 6 lb 12.1 oz (3065 g) APGAR: 9, 9  Newborn Delivery   Birth date/time:  09/23/2017 22:00:00 Delivery type:  C-Section, Low Transverse C-section categorization:  Primary     Baby Feeding: Breast Disposition:home with mother   09/26/2017 Marthenia RollingScott Bland, DO   I spoke with and examined patient and agree with resident/PA/SNM's note and plan of care. Eating, drinking, voiding, ambulating well.  +flatus.  Lochia and pain wnl.  Denies dizziness, lightheadedness, or sob. Does report pain w/ urination, but is improving. Denies any  other uti sx. Will send ua/cx. Breastfeeding. Plans POPs.  Cheral MarkerKimberly R. Edouard Gikas, CNM, [facility]NP-BC 09/27/2017 9:43 AM

## 2018-02-04 ENCOUNTER — Encounter: Payer: Medicaid Other | Admitting: Obstetrics & Gynecology

## 2019-01-19 IMAGING — US US OB TRANSVAGINAL
1 series · 15 of 28 positions shown · non-contrast
Comparison: None.

ADDENDUM:
Addendum for correction of

EDC:  10/05/2017.
CLINICAL DATA: Patient with abdominal pain. Assess for viability of
pregnancy.
EXAM:
OBSTETRIC <14 WK US AND TRANSVAGINAL OB US
TECHNIQUE: Both transabdominal and transvaginal ultrasound examinations were
performed for complete evaluation of the gestation as well as the
maternal uterus, adnexal regions, and pelvic cul-de-sac.
Transvaginal technique was performed to assess early pregnancy.

[Series 1: us ob transvaginal · 15 of 89 slices shown]
[im 1/89]
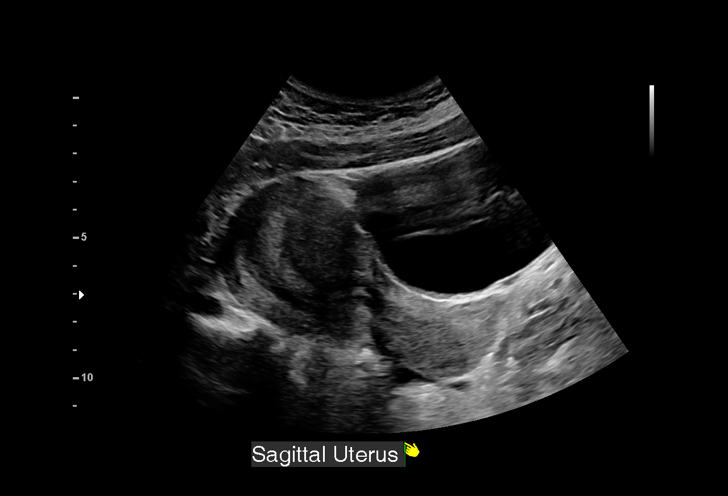
[im 7/89]
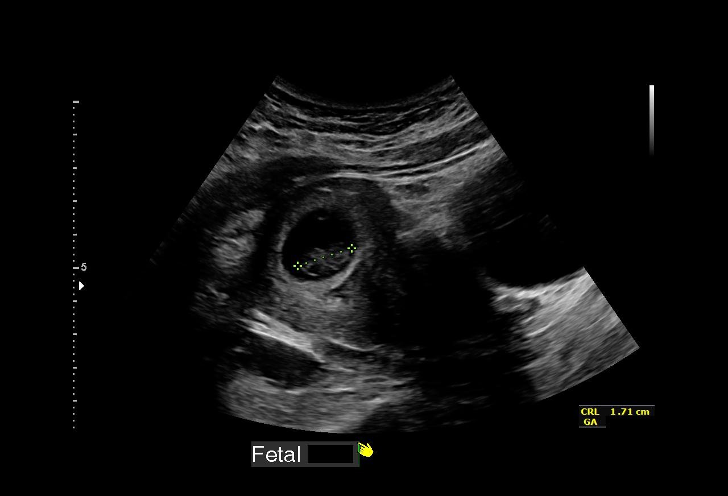
[im 14/89]
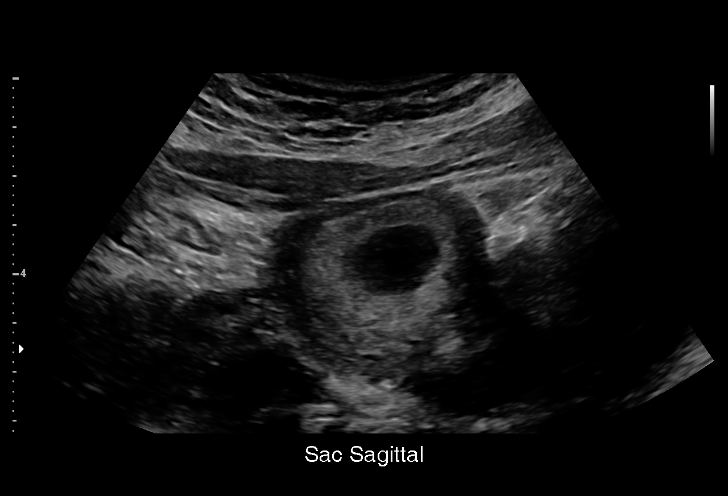
[im 20/89]
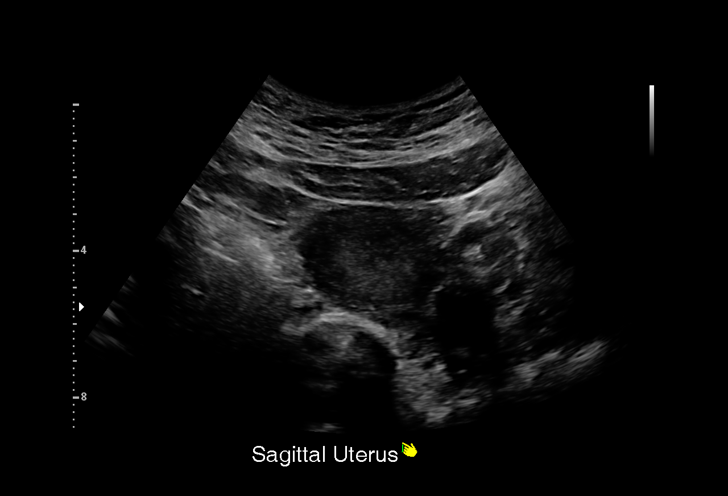
[im 27/89]
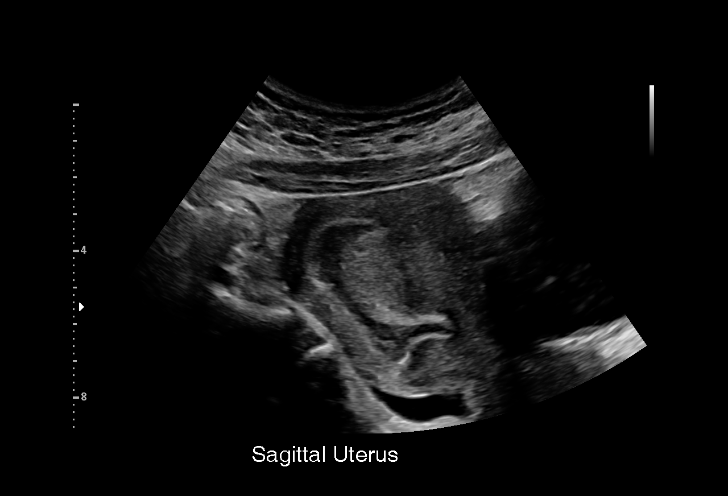
[im 33/89]
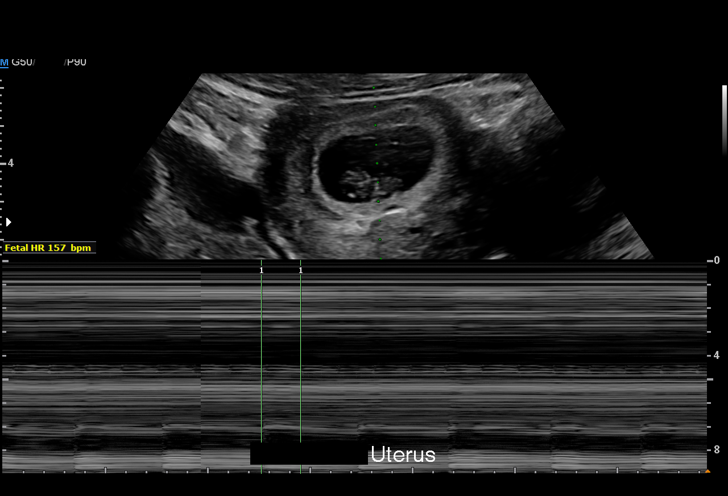
[im 40/89]
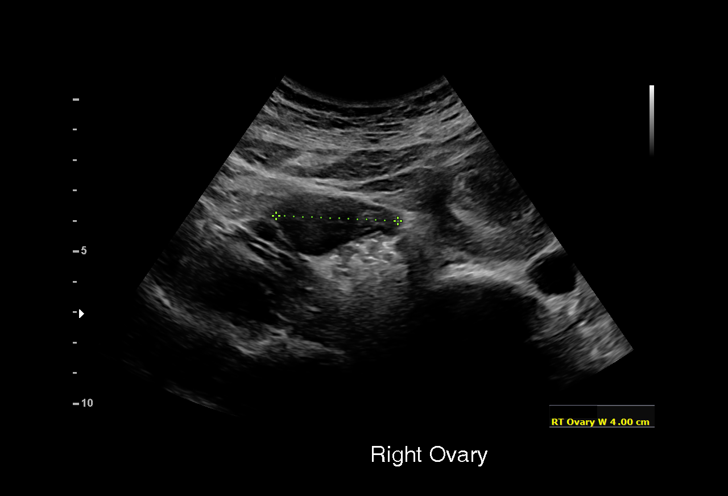
[im 46/89]
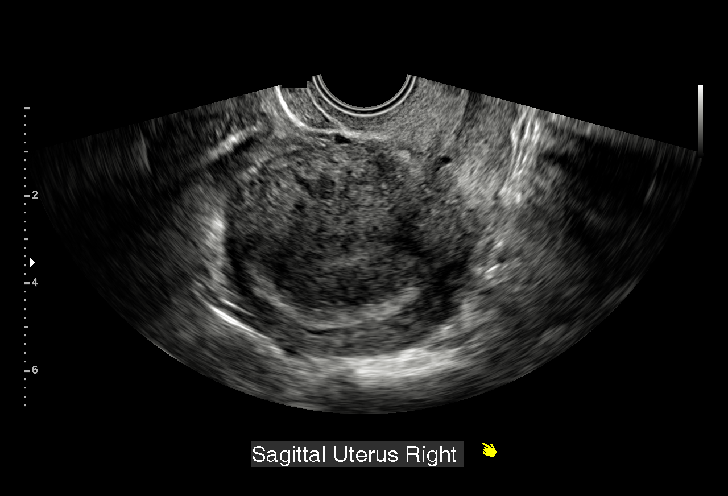
[im 49/89]
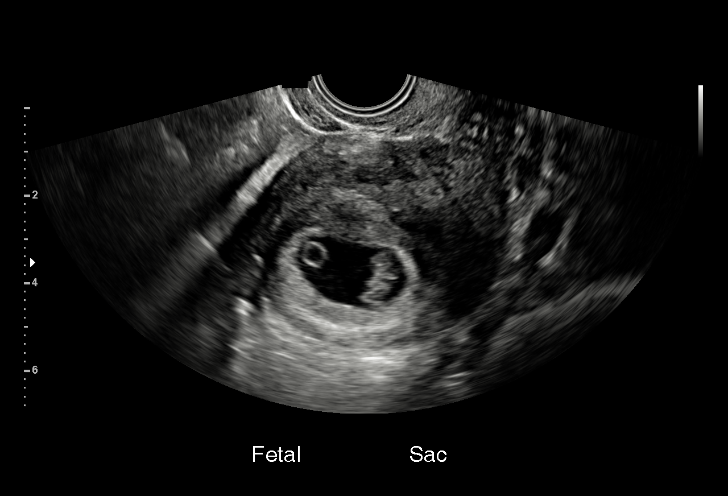
[im 56/89]
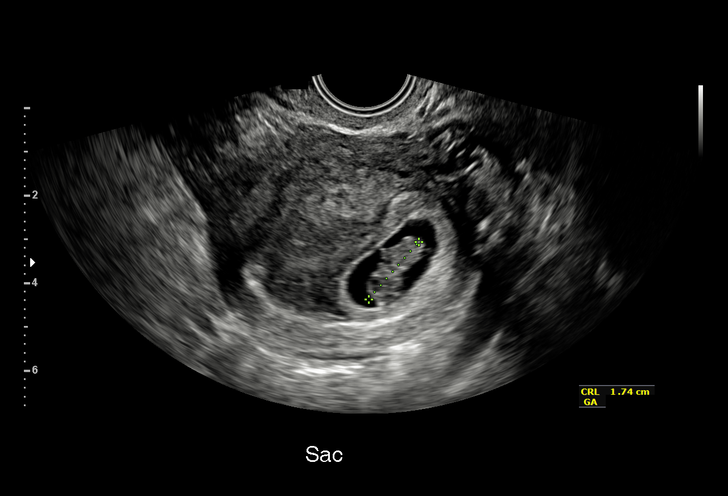
[im 62/89]
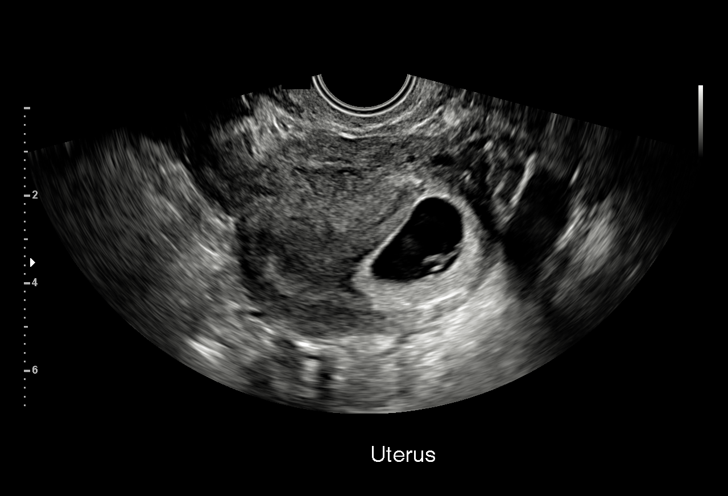
[im 69/89]
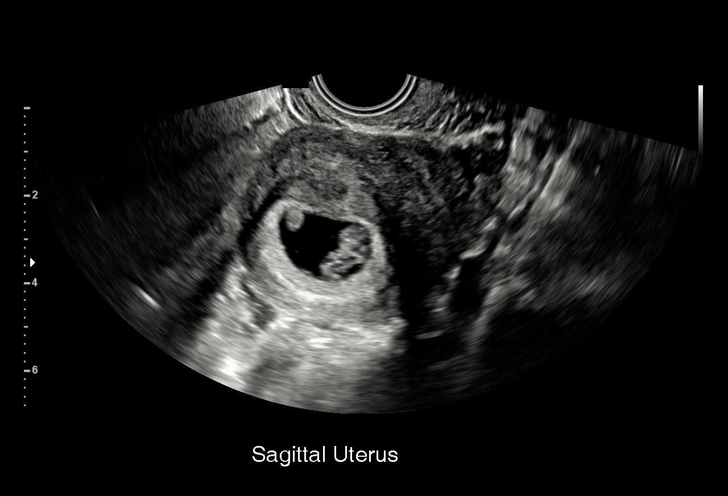
[im 75/89]
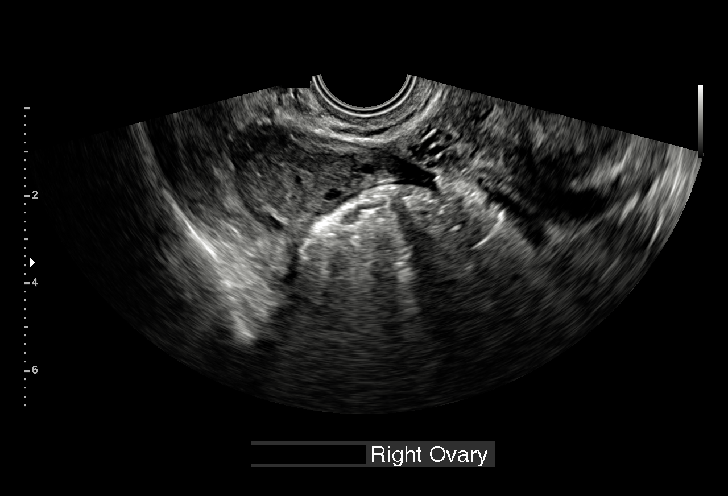
[im 82/89]
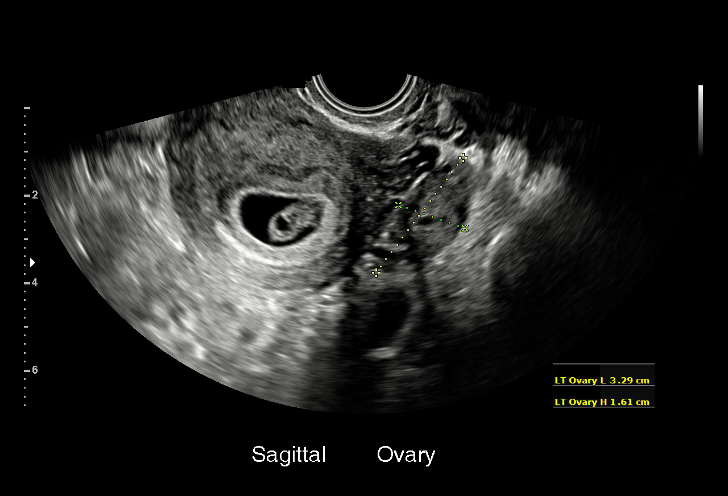
[im 89/89]
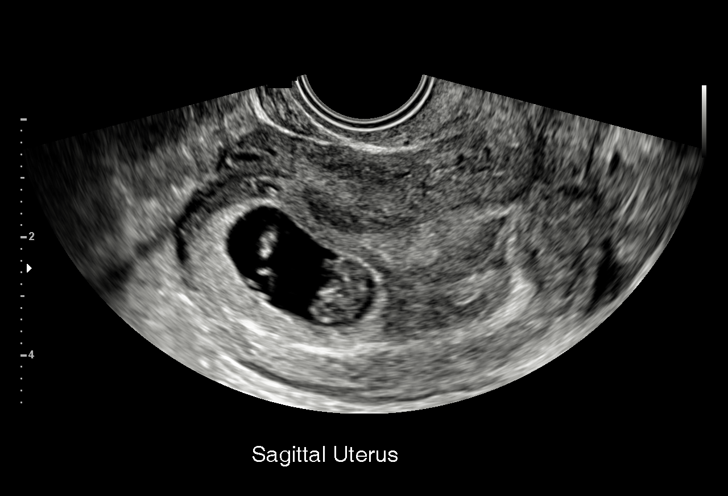

[15 of 28 positions shown; findings below may reference images not displayed]

FINDINGS: Intrauterine gestational sac: Single

Yolk sac:  Visualized.

Embryo:  Visualized.

Cardiac Activity: Visualized.

Heart Rate: 162  bpm

CRL:  16  mm   8 w   0 d                  US EDC: 09/27/2017

Subchorionic hemorrhage:  Small

Maternal uterus/adnexae: Normal right left ovaries. No free fluid in
the pelvis.
IMPRESSION: Single live intrauterine gestation.  Small subchorionic hemorrhage.

## 2019-02-22 IMAGING — US US MFM FETAL NUCHAL TRANSLUCENCY
1 series · 15 of 28 positions shown · non-contrast
Comparison: none

[Series 1: us mfm fetal nuchal translucency · 15 of 42 slices shown]
[im 1/42]
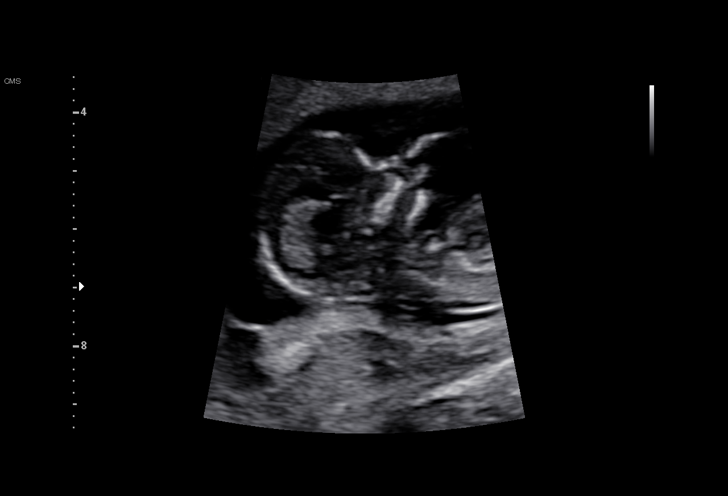
[im 4/42]
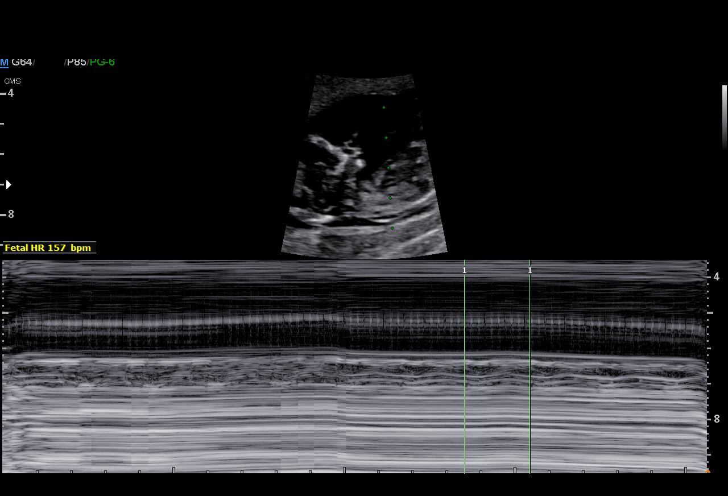
[im 7/42]
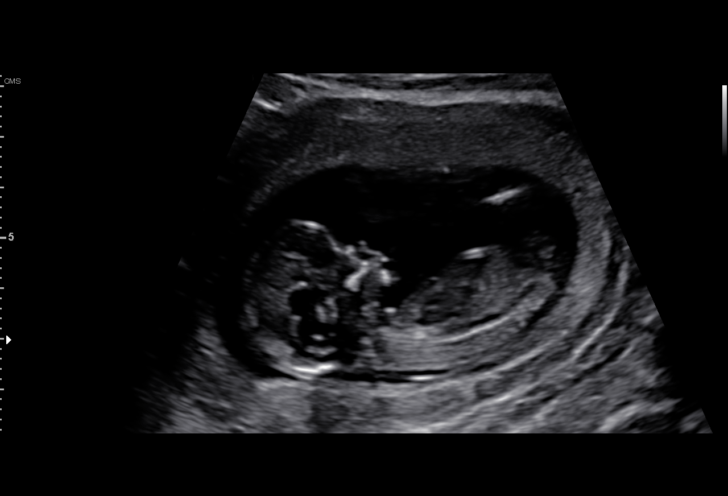
[im 10/42]
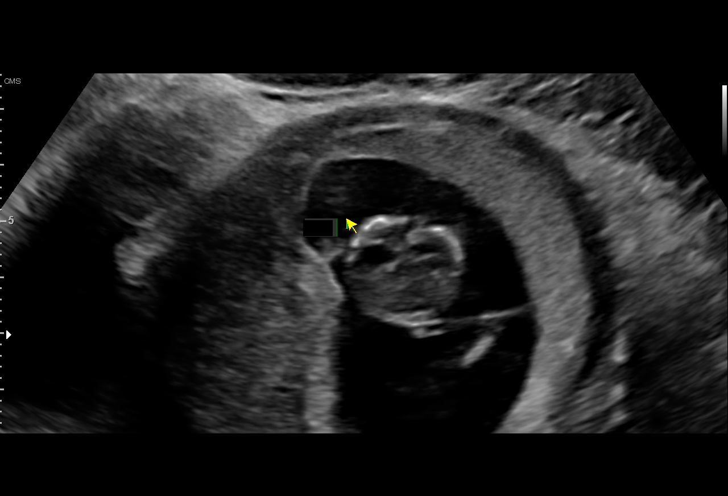
[im 13/42]
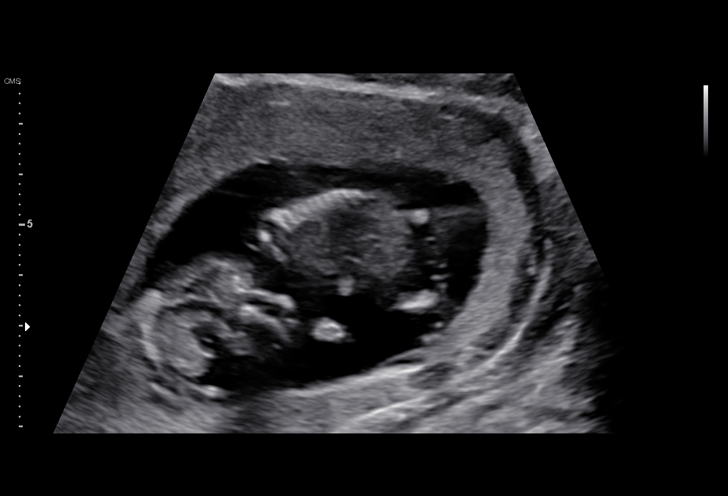
[im 16/42]
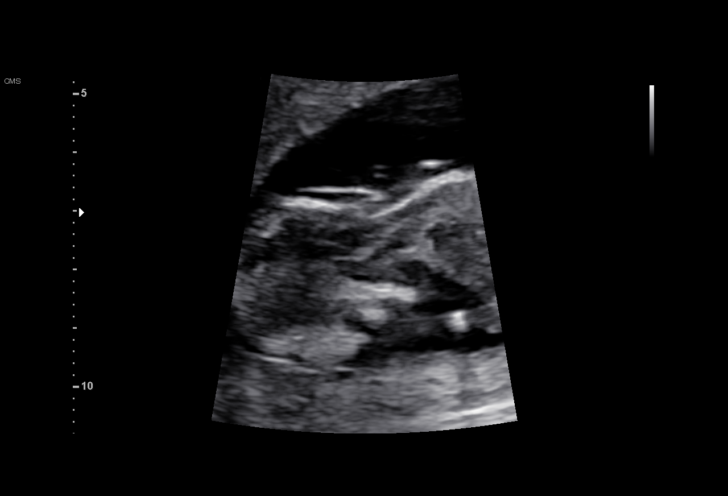
[im 19/42]
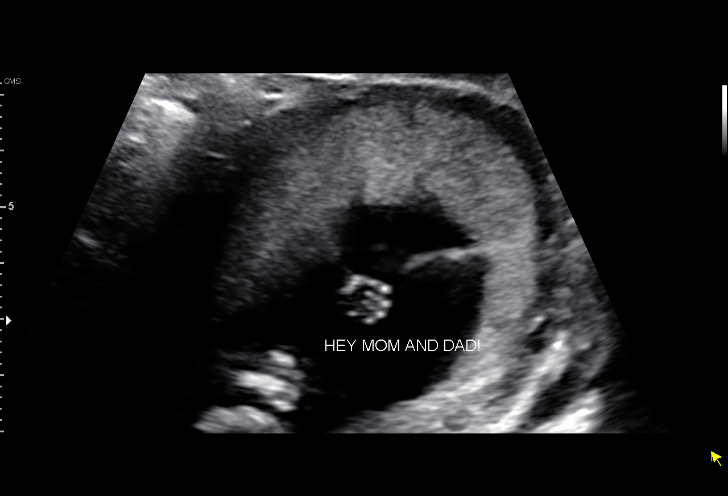
[im 22/42]
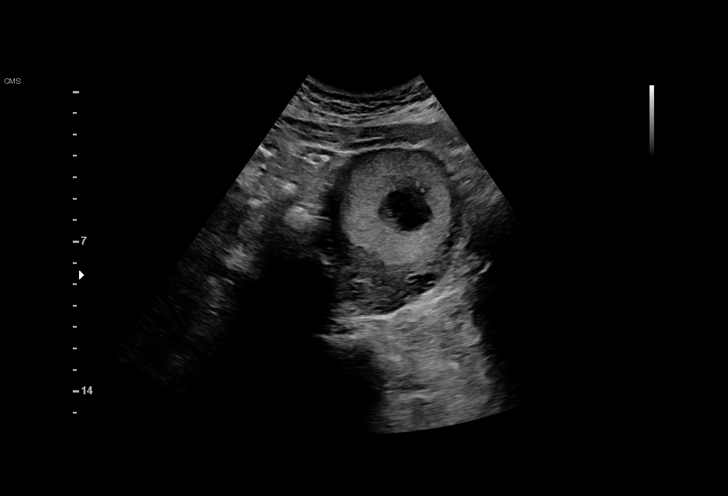
[im 23/42]
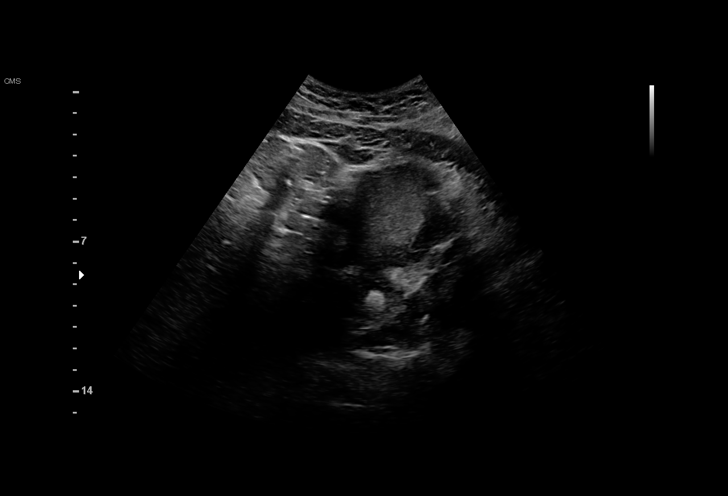
[im 26/42]
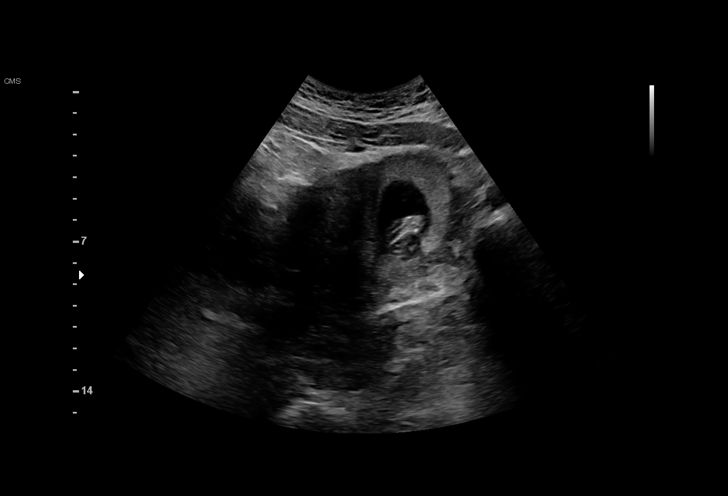
[im 29/42]
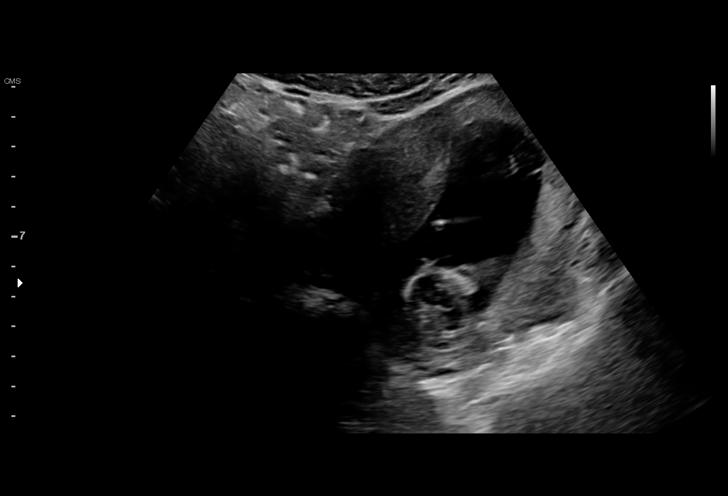
[im 32/42]
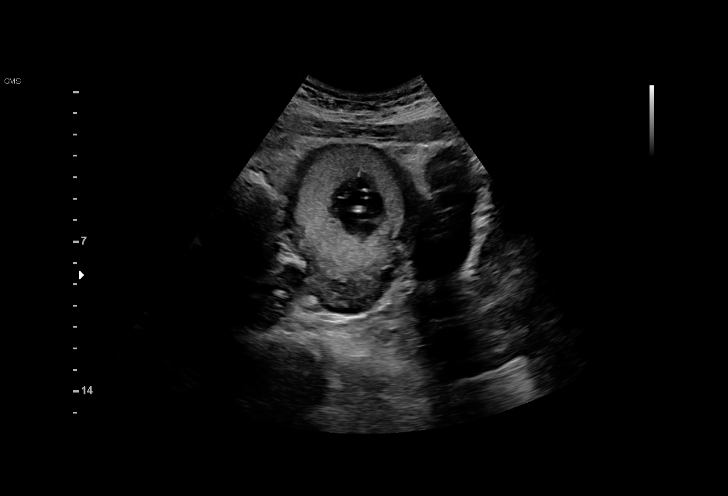
[im 35/42]
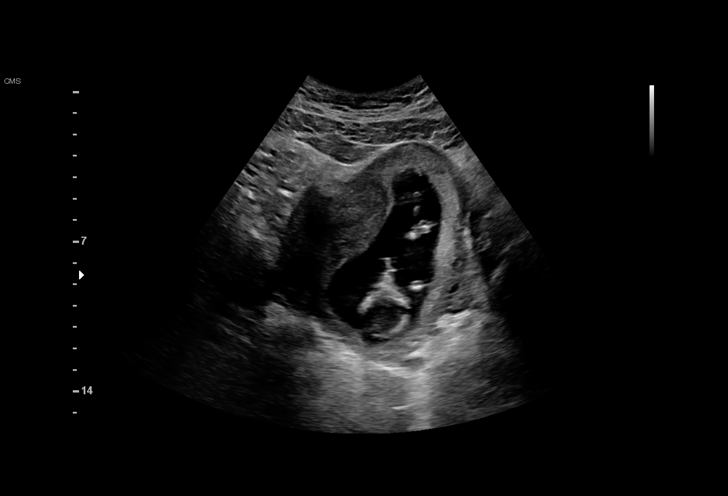
[im 38/42]
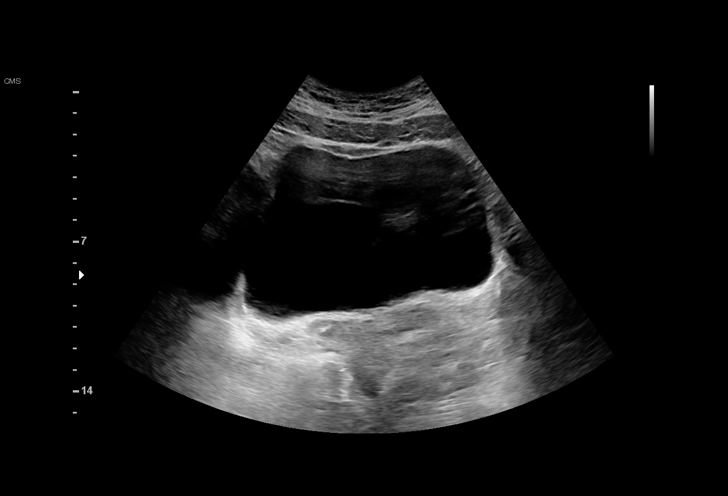
[im 42/42]
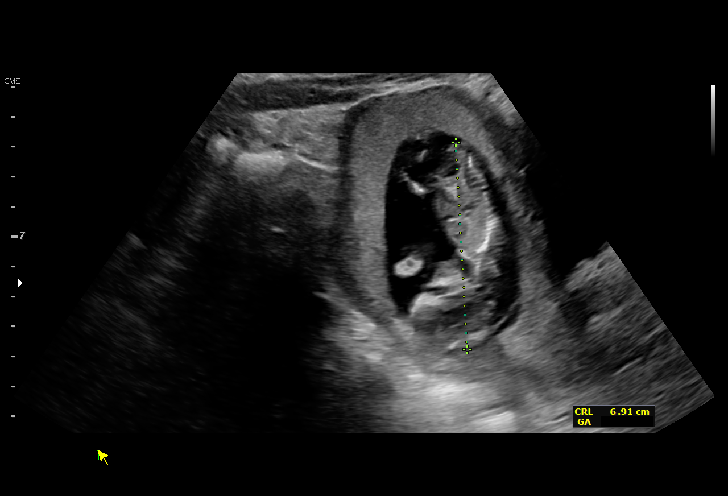

[15 of 28 positions shown; findings below may reference images not displayed]

[REDACTED]-
Faculty Physician

TRANSLUCENCY

1  TIGER                [id number]      [id number]     [id number]
[place]
Indications

12 weeks gestation of pregnancy
Encounter for nuchal translucency
OB History

Gravidity:    1         Term:   0        Prem:   0        SAB:   0
TOP:          0       Ectopic:  0        Living: 0
Fetal Evaluation

Num Of Fetuses:     1
Preg. Location:     Intrauterine
Gest. Sac:          Intrauterine
Fetal Pole:         Visualized
Fetal Heart         157
Rate(bpm):
Cardiac Activity:   Observed

Amniotic Fluid
AFI FV:      Subjectively within normal limits
Biometry

CRL:      67.2  mm     G. Age:  12w 6d                  EDD:   10/05/17
Gestational Age

LMP:           13w 2d        Date:  12/26/16                 EDD:   10/02/17
Best:          12w 6d     Det. By:  Early Ultrasound         EDD:   10/05/17
(02/23/17)
1st Trimester Genetic Sonogram Screening

CRL:            67.2  mm    G. Age:   12w 6d                 EDD:   10/05/17
Nuc Trans:       1.5  mm
Nasal Bone:                 Present
Cervix Uterus Adnexa

Cervix
Closed

Uterus
No abnormality visualized.

Cul De Sac:   No free fluid seen.

Adnexa:       No abnormality visualized.
Impression

IUP at 12+6 weeks, here for first trimester screening
Normal fetal cardiac activity
Normal fetal morphology for this gestational age
CRL confirms assigned EDC
Nuchal translucency measures 1.5mm
Recommendations

Serum analytes will be drawn today
Recommend anatomic survey in 6-8 weeks
